# Patient Record
Sex: Male | Born: 1970 | Race: Black or African American | Hispanic: No | Marital: Married | Smoking: Current every day smoker
Health system: Southern US, Community
[De-identification: ages and names within clinical notes are randomized; demographics above are authoritative.]

## PROBLEM LIST (undated history)

## (undated) DIAGNOSIS — W3400XA Accidental discharge from unspecified firearms or gun, initial encounter: Secondary | ICD-10-CM

## (undated) DIAGNOSIS — J189 Pneumonia, unspecified organism: Secondary | ICD-10-CM

## (undated) HISTORY — PX: EYE SURGERY: SHX253

---
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
## 1997-08-10 ENCOUNTER — Inpatient Hospital Stay (HOSPITAL_COMMUNITY): Admission: EM | Admit: 1997-08-10 | Discharge: 1997-08-11 | Payer: Self-pay | Admitting: Emergency Medicine

## 1997-08-20 ENCOUNTER — Encounter: Admission: RE | Admit: 1997-08-20 | Discharge: 1997-08-20 | Payer: Self-pay | Admitting: Internal Medicine

## 1997-09-11 ENCOUNTER — Encounter: Admission: RE | Admit: 1997-09-11 | Discharge: 1997-09-11 | Payer: Self-pay | Admitting: Hematology and Oncology

## 2011-04-14 DIAGNOSIS — J189 Pneumonia, unspecified organism: Secondary | ICD-10-CM

## 2011-04-14 HISTORY — DX: Pneumonia, unspecified organism: J18.9

## 2011-09-11 ENCOUNTER — Emergency Department (HOSPITAL_COMMUNITY)
Admission: EM | Admit: 2011-09-11 | Discharge: 2011-09-11 | Disposition: A | Discharge status: Home / Self Care | Payer: Self-pay | Attending: Emergency Medicine | Admitting: Emergency Medicine

## 2011-09-11 ENCOUNTER — Encounter (HOSPITAL_COMMUNITY): Payer: Self-pay | Admitting: Emergency Medicine

## 2011-09-11 DIAGNOSIS — G8929 Other chronic pain: Secondary | ICD-10-CM | POA: Insufficient documentation

## 2011-09-11 DIAGNOSIS — K029 Dental caries, unspecified: Secondary | ICD-10-CM | POA: Insufficient documentation

## 2011-09-11 DIAGNOSIS — K0889 Other specified disorders of teeth and supporting structures: Secondary | ICD-10-CM

## 2011-09-11 DIAGNOSIS — K1370 Unspecified lesions of oral mucosa: Secondary | ICD-10-CM | POA: Insufficient documentation

## 2011-09-11 DIAGNOSIS — F17200 Nicotine dependence, unspecified, uncomplicated: Secondary | ICD-10-CM | POA: Insufficient documentation

## 2011-09-11 MED ORDER — PENICILLIN V POTASSIUM 500 MG PO TABS: 500.0000 mg | ORAL_TABLET | Freq: Four times a day (QID) | ORAL | Status: DC

## 2011-09-11 MED ORDER — HYDROCODONE-ACETAMINOPHEN 5-325 MG PO TABS: 1.0000 | ORAL_TABLET | Freq: Four times a day (QID) | ORAL | Status: DC | PRN

## 2011-09-11 NOTE — ED Notes (Signed)
Pt denies any questions upon discharge. 

## 2011-09-11 NOTE — Discharge Instructions (Signed)
Read the information below.  Call the dentist listed above for a close follow up appointment.  You may also use the resources below for further dental care.  If you develop high fevers, swelling in your throat or difficulty swallowing or breathing, return to the ER immediately.  You may return to the ER at any time for worsening condition or any new symptoms that concern you.   Dental Pain Toothache is pain in or around a tooth. It may get worse with chewing or with cold or heat.  HOME CARE  Your dentist may use a numbing medicine during treatment. If so, you may need to avoid eating until the medicine wears off. Ask your dentist about this.   Only take medicine as told by your dentist or doctor.   Avoid chewing food near the painful tooth until after all treatment is done. Ask your dentist about this.  GET HELP RIGHT AWAY IF:   The problem gets worse or new problems appear.   You have a fever.   There is redness and puffiness (swelling) of the face, jaw, or neck.   You cannot open your mouth.   There is pain in the jaw.   There is very bad pain that is not helped by medicine.  MAKE SURE YOU:   Understand these instructions.   Will watch your condition.   Will get help right away if you are not doing well or get worse.  Document Released: 09/16/2007 Document Revised: 03/19/2011 Document Reviewed: 09/16/2007 [facility] Patient Information 2012 [place], [place].  Dental Assistance If the dentist on-call cannot see you, please use the resources below:   Patients with Medicaid: [facility] [phone] [address], [phone] [address] [phone]  If unable to pay, or uninsured, contact [facility] [phone]) or [facility] Department [phone] in [place], [phone] in [facility]) to become qualified for the adult dental clinic  Other Low-Cost Community Dental Services: [facility] [address] Natasha Bence [place], [place],  [address]    [phone], Ext. [phone]    2nd and 4th Thursday of the month at 6:30am    10 clients each day by appointment, can sometimes see walk-in     patients if someone does not show for an appointment [facility]- [address] Ether Griffins [place], [place], [address]    [phone] [facility] [address], [place], [place], [address]    [phone]  [facility] Health Department- [phone] [facility] Health Department- [phone] [facility] Department- [phone]

## 2011-09-11 NOTE — ED Provider Notes (Signed)
History     CSN: [id number]  Arrival date & time 09/11/11  2146   First MD Initiated Contact with Patient 09/11/11 2252      Chief Complaint  Patient presents with  . Dental Pain    (Consider location/radiation/quality/duration/timing/severity/associated sxs/prior treatment) HPI Comments: Patient reports he has had chronic dental pain for many years, but over the past few days it had gotten much worse.  Reports pain in his left lower molars, also noted a new lesion on the roof of his mouth that is painless.  Denies fevers, sore throat, difficulty swallowing or breathing.  Does not have a dentist for follow up.    Patient is a 41 y.o. male presenting with tooth pain. The history is provided by the patient.  Dental PainPrimary symptoms do not include fever, shortness of breath or sore throat.  Additional symptoms do not include: trouble swallowing.    History reviewed. No pertinent past medical history.  History reviewed. No pertinent past surgical history.  No family history on file.  History  Substance Use Topics  . Smoking status: Current Everyday Smoker  . Smokeless tobacco: Not on file  . Alcohol Use: No      Review of Systems  Constitutional: Negative for fever and chills.  HENT: Positive for dental problem. Negative for sore throat and trouble swallowing.   Respiratory: Negative for shortness of breath.     Allergies  Review of patient's allergies indicates no known allergies.  Home Medications   Current Outpatient Rx  Name Route Sig Dispense Refill  . AMOXICILLIN 500 MG PO CAPS Oral Take 500 mg by mouth 4 (four) times daily.    . IBUPROFEN 800 MG PO TABS Oral Take 800 mg by mouth every 6 (six) hours as needed. For pain.      BP 133/82  Pulse 81  Temp(Src) 98 F (36.7 C) (Oral)  Resp 16  SpO2 96%  Physical Exam  Nursing note and vitals reviewed. Constitutional: He appears well-developed and well-nourished.  HENT:  Head: Normocephalic and  atraumatic.  Mouth/Throat: Uvula is midline. Oral lesions present. Dental caries present. No uvula swelling. Tonsillar abscesses present. No oropharyngeal exudate, posterior oropharyngeal edema or posterior oropharyngeal erythema.         Left lower molars with diffuse tenderness to percussion.    Neck: Neck supple.  Pulmonary/Chest: Effort normal.  Neurological: He is alert.    ED Course  Procedures (including critical care time)  Labs Reviewed - No data to display No results found.   1. Pain, dental   2. Mouth lesion       MDM  Afebrile patient with chronic dental pain and severe dental decay with increased pain over several days.  Pt also with painless papule over roof of mouth - does not appear to be related.  Pt d/c home with pain medication, antibiotic, dental resources.  Pt informed that he must call on call dentist within 48 hrs. Return precautions given.  Patient verbalizes understanding and agrees with plan.          Dillard Cannon [place], [place] 09/11/11 2318

## 2011-09-11 NOTE — ED Notes (Signed)
PT. REPORTS RIGHT UPPER MOLAR PAIN FOR 2 DAYS .  

## 2011-09-12 ENCOUNTER — Emergency Department (HOSPITAL_COMMUNITY)
Admission: EM | Admit: 2011-09-12 | Discharge: 2011-09-12 | Disposition: A | Discharge status: Home / Self Care | Payer: Self-pay | Attending: Emergency Medicine | Admitting: Emergency Medicine

## 2011-09-12 ENCOUNTER — Encounter (HOSPITAL_COMMUNITY): Payer: Self-pay | Admitting: *Deleted

## 2011-09-12 DIAGNOSIS — K029 Dental caries, unspecified: Secondary | ICD-10-CM | POA: Insufficient documentation

## 2011-09-12 DIAGNOSIS — K0889 Other specified disorders of teeth and supporting structures: Secondary | ICD-10-CM

## 2011-09-12 DIAGNOSIS — F17200 Nicotine dependence, unspecified, uncomplicated: Secondary | ICD-10-CM | POA: Insufficient documentation

## 2011-09-12 MED ORDER — OXYCODONE-ACETAMINOPHEN 5-325 MG PO TABS: 2.0000 | ORAL_TABLET | ORAL | Status: DC | PRN

## 2011-09-12 MED ORDER — OXYCODONE-ACETAMINOPHEN 5-325 MG PO TABS
2.0000 | ORAL_TABLET | Freq: Once | ORAL | Status: AC
  Administered 2011-09-12: 2 via ORAL
  Filled 2011-09-12: qty 2

## 2011-09-12 NOTE — ED Provider Notes (Signed)
Medical screening examination/treatment/procedure(s) were performed by non-physician practitioner and as supervising physician I was immediately available for consultation/collaboration.   Loren Racer, MD 09/12/11 0010

## 2011-09-12 NOTE — ED Notes (Signed)
Pa  at bedside. 

## 2011-09-12 NOTE — ED Notes (Signed)
Toothache for 3 days,  He wa seen here last pm and the pain ed is not strong enough

## 2011-09-12 NOTE — ED Notes (Signed)
On assessment, pt reports taking one of his own pain meds (vicodin, 1 tab) about 10 minutes ago. Requested pt that he not take any more of his own meds at this time

## 2011-09-12 NOTE — Discharge Instructions (Signed)
Apply warm compresses to jaw throughout the day. Take antibiotic and completion. Take hydrocodone-acetaminophen as directed, as needed for pain but do not drive or operate machinery with pain medication use. Alternate with ibuprofen use. Followup with a dentist is very important for ongoing evaluation and management of recurrent dental pain. However return to emergency department for emergent changing or worsening symptoms.   Dental Caries  Tooth decay (dental caries, cavities) is the most common of all oral diseases. It occurs in all ages but is more common in children and young adults.  CAUSES  Bacteria in your mouth combine with foods (particularly sugars and starches) to produce plaque. Plaque is a substance that sticks to the hard surfaces of teeth. The bacteria in the plaque produce acids that attack the enamel of teeth. Repeated acid attacks dissolve the enamel and create holes in the teeth. Root surfaces of teeth may also get these holes.  Other contributing factors include:   Frequent snacking and drinking of cavity-producing foods and liquids.   Poor oral hygiene.   Dry mouth.   Substance abuse such as methamphetamine.   Broken or poor fitting dental restorations.   Eating disorders.   Gastroesophageal reflux disease (GERD).   Certain radiation treatments to the head and neck.  SYMPTOMS  At first, dental decay appears as white, chalky areas on the enamel. In this early stage, symptoms are seldom present. As the decay progresses, pits and holes may appear on the enamel surfaces. Progression of the decay will lead to softening of the hard layers of the tooth. At this point you may experience some pain or achy feeling after sweet, hot, or cold foods or drinks are consumed. If left untreated, the decay will reach the internal structures of the tooth and produce severe pain. Extensive dental treatment, such as root canal therapy, may be needed to save the tooth at this late stage of  decay development.  DIAGNOSIS  Most cavities will be detected during regular check-ups. A thorough medical and dental history will be taken by the dentist. The dentist will use instruments to check the surfaces of your teeth for any breakdown or discoloration. Some dentists have special instruments, such as lasers, that detect tooth decay. Dental X-rays may also show some cavities that are not visible to the eye (such as between the contact areas of the teeth). TREATMENT  Treatment involves removal of the tooth decay and replacement with a restorative material such as silver, gold, or composite (white) material. However, if the decay involves a large area of the tooth and there is little remaining healthy tooth structure, a cap (crown) will be fitted over the remaining structure. If the decay involves the center part of the tooth (pulp), root canal treatment will be needed before any type of dental restoration is placed. If the tooth is severely destroyed by the decay process, leaving the remaining tooth structures unrestorable, the tooth will need to be pulled (extracted). Some early tooth decay may be reversed by fluoride treatments and thorough brushing and flossing at home. PREVENTION   Eat healthy foods. Restrict the amount of sugary, starchy foods and liquids you consume. Avoid frequent snacking and drinking of unhealthy foods and liquids.   Sealants can help with prevention of cavities. Sealants are composite resins applied onto the biting surfaces of teeth at risk for decay. They smooth out the pits and grooves and prevent food from being trapped in them. This is done in early childhood before tooth decay has started.  Fluoride tablets may also be prescribed to children between 6 months and 36 years of age if your drinking water is not fluoridated. The fluoride absorbed by the tooth enamel makes teeth less susceptible to decay. Thorough daily cleaning with a toothbrush and dental floss is the  best way to prevent cavities. Use of a fluoride toothpaste is highly recommended. Fluoride mouth rinses may be used in specific cases.   Topical application of fluoride by your dentist is important in children.   Regular visits with a dentist for checkups and cleanings are also important.  SEEK IMMEDIATE DENTAL CARE IF:  You have a fever.   You develop redness and swelling of your face, jaw, or neck.   You develop swelling around a tooth.   You are unable to open your mouth or cannot swallow.   You have severe pain uncontrolled by pain medicine.  Document Released: 12/20/2001 Document Revised: 03/19/2011 Document Reviewed: 09/04/2010 [facility] Patient Information 2012 [place], [place].

## 2011-09-12 NOTE — ED Provider Notes (Signed)
History     CSN: [id number]  Arrival date & time 09/12/11  2024   First MD Initiated Contact with Patient 09/12/11 2242      Chief Complaint  Patient presents with  . Dental Pain    (Consider location/radiation/quality/duration/timing/severity/associated sxs/prior treatment) Patient is a 41 y.o. male presenting with tooth pain. The history is provided by the patient and medical records.  Dental Pain  Patient was seen in emergency department yesterday for a three-day history of toothache associated with massive dental decay of upper and lower premolars and molar presents to emergency department stating that despite taking his penicillin in his pain medication he has ongoing pain. Patient states pain medication will give brief temporary improvement in pain and then the pain returns. He denies fevers, chills, headache, dizziness, nausea, vomiting, difficulty breathing or swallowing. He denies any facial swelling. Patient has no known medical problems and takes no medicines on regular basis. Patient states he did not call the on-call dentist because it was the weekend and he states "I was afraid I cannot pay the dentist anyway." Patient states pain is aggravated by cold air and by chewing.  History reviewed. No pertinent past medical history.  History reviewed. No pertinent past surgical history.  No family history on file.  History  Substance Use Topics  . Smoking status: Current Everyday Smoker  . Smokeless tobacco: Not on file  . Alcohol Use: No      Review of Systems  All other systems reviewed and are negative.    Allergies  Review of patient's allergies indicates no known allergies.  Home Medications   Current Outpatient Rx  Name Route Sig Dispense Refill  . HYDROCODONE-ACETAMINOPHEN 5-325 MG PO TABS Oral Take 1 tablet by mouth every 6 (six) hours as needed. For pain    . IBUPROFEN 800 MG PO TABS Oral Take 800 mg by mouth every 6 (six) hours as needed. For pain.    Marland Kitchen  PENICILLIN V POTASSIUM 500 MG PO TABS Oral Take 500 mg by mouth 4 (four) times daily.      BP 142/94  Pulse 64  Temp(Src) 99.2 F (37.3 C) (Oral)  Resp 16  SpO2 97%  Physical Exam  Nursing note and vitals reviewed. Constitutional: He is oriented to person, place, and time. He appears well-developed and well-nourished. No distress.  HENT:  Head: Normocephalic and atraumatic.  Right Ear: External ear normal.  Left Ear: External ear normal.  Mouth/Throat: Oropharynx is clear and moist.       Patent airway without uvula, lingual or sublingual edema.   Massive dental decay of bilateral upper and lower premolars and molars with TTP and gingivitis but no gingival mass or fluctuance.   Eyes: Conjunctivae are normal.  Neck: Normal range of motion. Neck supple.  Cardiovascular: Normal rate.   Pulmonary/Chest: Effort normal.  Musculoskeletal: Normal range of motion.  Lymphadenopathy:    He has no cervical adenopathy.  Neurological: He is alert and oriented to person, place, and time.  Skin: Skin is warm and dry. No rash noted. He is not diaphoretic. No erythema.  Psychiatric: He has a normal mood and affect.    ED Course  Procedures (including critical care time)  PO percocet  Labs Reviewed - No data to display No results found.   1. Pain, dental   2. Dental decay       MDM  Dental pain associated with dental decay but afebrile, non toxic appearing and NAD. No signs or symptoms  of dental abscess. I spoke at length with patient about dental pain associated with dental decay and that additional pain would be recurrent and ongoing until the dental decay was seen, evaluated, and fixed by dentist. He voices his understanding. I spoke at length with patient about the importance of calling the on-call dentist to be seen. Patient voices his understanding.        [place], [place] 09/13/11 0000

## 2011-09-13 ENCOUNTER — Encounter (HOSPITAL_COMMUNITY): Payer: Self-pay | Admitting: *Deleted

## 2011-09-13 ENCOUNTER — Emergency Department (HOSPITAL_COMMUNITY)
Admission: EM | Admit: 2011-09-13 | Discharge: 2011-09-13 | Disposition: A | Discharge status: Home / Self Care | Payer: Self-pay | Attending: Emergency Medicine | Admitting: Emergency Medicine

## 2011-09-13 DIAGNOSIS — K047 Periapical abscess without sinus: Secondary | ICD-10-CM | POA: Insufficient documentation

## 2011-09-13 DIAGNOSIS — F17200 Nicotine dependence, unspecified, uncomplicated: Secondary | ICD-10-CM | POA: Insufficient documentation

## 2011-09-13 MED ORDER — OXYCODONE-ACETAMINOPHEN 5-325 MG PO TABS: 1.0000 | ORAL_TABLET | Freq: Four times a day (QID) | ORAL | Status: AC | PRN

## 2011-09-13 MED ORDER — OXYCODONE-ACETAMINOPHEN 5-325 MG PO TABS
1.0000 | ORAL_TABLET | Freq: Once | ORAL | Status: AC
  Administered 2011-09-13: 1 via ORAL
  Filled 2011-09-13: qty 1

## 2011-09-13 NOTE — ED Provider Notes (Signed)
History     CSN: [id number]  Arrival date & time 09/13/11  1334   First MD Initiated Contact with Patient 09/13/11 1353      Chief Complaint  Patient presents with  . Dental Pain  . Facial Swelling    (Consider location/radiation/quality/duration/timing/severity/associated sxs/prior treatment) HPI  Patient presents to the emergency department with a dental complaint. Symptoms began 1 week ago. The patient has tried to alleviate pain with hydrocodone.  Pain rated at a 10/10, characterized as throbbing in nature and located left lower jaw. Patient denies fever, night sweats, chills, difficulty swallowing or opening mouth, SOB, nuchal rigidity or decreased ROM of neck.  Patient does not have a dentist and requests a resource guide at discharge.  Patient seen in ED 2 times prior in the past week for pain. Has been not taking more than prescribed hydrocodone and has been taking the appropriate amount of Penicillin. He presents with facial swelling today.    History reviewed. No pertinent past medical history.  History reviewed. No pertinent past surgical history.  No family history on file.  History  Substance Use Topics  . Smoking status: Current Everyday Smoker  . Smokeless tobacco: Not on file  . Alcohol Use: No      Review of Systems   HEENT: denies blurry vision or change in hearing PULMONARY: Denies difficulty breathing and SOB CARDIAC: denies chest pain or heart palpitations MUSCULOSKELETAL:  denies being unable to ambulate ABDOMEN AL: denies abdominal pain GU: denies loss of bowel or urinary control NEURO: denies numbness and tingling in extremities SKIN: no new rashes PSYCH: patient behavior is normal NECK: Not complaining of neck pain     Allergies  Review of patient's allergies indicates no known allergies.  Home Medications   Current Outpatient Rx  Name Route Sig Dispense Refill  . HYDROCODONE-ACETAMINOPHEN 5-325 MG PO TABS Oral Take 1 tablet by  mouth every 6 (six) hours as needed. For pain    . IBUPROFEN 800 MG PO TABS Oral Take 800 mg by mouth every 6 (six) hours as needed. For pain.    Marland Kitchen PENICILLIN V POTASSIUM 500 MG PO TABS Oral Take 500 mg by mouth 4 (four) times daily.    . OXYCODONE-ACETAMINOPHEN 5-325 MG PO TABS Oral Take 1 tablet by mouth every 6 (six) hours as needed for pain. 10 tablet 0    BP 128/90  Pulse 75  Temp(Src) 98.8 F (37.1 C) (Oral)  Resp 19  SpO2 97%  Physical Exam  Nursing note and vitals reviewed. Constitutional: He appears well-developed and well-nourished.  HENT:  Head: Normocephalic and atraumatic. No trismus in the jaw.  Mouth/Throat: Oropharynx is clear and moist. Dental abscesses (1cm dental abscess to medial side of molar) and dental caries present.    Eyes: Conjunctivae and EOM are normal. Pupils are equal, round, and reactive to light.  Neck: Normal range of motion. Neck supple.  Cardiovascular: Normal rate and regular rhythm.   Pulmonary/Chest: Effort normal and breath sounds normal.    ED Course  Procedures (including critical care time)  Labs Reviewed - No data to display No results found.   1. Dental abscess       MDM   INCISION AND DRAINAGE Performed by: Dorthula Matas Consent: Verbal consent obtained. Risks and benefits: risks, benefits and alternatives were discussed Type: abscess  Body area: left lower dental abscess  Anesthesia: local infiltration  Local anesthetic: topical mucosal hurricane spray  Anesthetic total: 1 second of spray  Complexity:  complex Blunt dissection to break up loculations  Drainage: purulent  Drainage amount: large  Packing material: none  Patient tolerance: Patient tolerated the procedure well with no immediate complications.   Patient given Percocet in ED, he states it worked better. Abscess drained which should provider significant relief. Patient still needs to continue antibiotics, discont hydrocodone and follow-up  with Dentist.  Pt given Rx for Percocets 5-325 (10 tabs) . Patient informed that they need to find a dentist and have the tooth pulled or the symptoms may be reoccurring. A Resource guide has been given with dental providers. Patient has been given return to ED precautions.          Dorthula Matas, PA 09/13/11 1439

## 2011-09-13 NOTE — ED Provider Notes (Signed)
Medical screening examination/treatment/procedure(s) were performed by non-physician practitioner and as supervising physician I was immediately available for consultation/collaboration.  Geoffery Lyons, MD 09/13/11 1540

## 2011-09-13 NOTE — Discharge Instructions (Signed)
Dental Abscess A dental abscess usually starts from an infected tooth. Antibiotic medicine and pain pills can be helpful, but dental infections require the attention of a dentist. Rinse around the infected area often with salt water (a pinch of salt in 8 oz of warm water). Do not apply heat to the outside of your face. See your dentist or oral surgeon as soon as possible.  SEEK IMMEDIATE MEDICAL CARE IF:  You have increasing, severe pain that is not relieved by medicine.   You or your child has an oral temperature above 102 F (38.9 C), not controlled by medicine.   Your baby is older than 3 months with a rectal temperature of 102 F (38.9 C) or higher.   Your baby is 3 months old or younger with a rectal temperature of 100.4 F (38 C) or higher.   You develop chills, severe headache, difficulty breathing, or trouble swallowing.   You have swelling in the neck or around the eye.  Document Released: 03/30/2005 Document Revised: 03/19/2011 Document Reviewed: 09/08/2006 [employer] Patient Information 2012 [employer]  RESOURCE GUIDE  Dental Problems  Patients with Medicaid: [facility]                     [facility] [address]                                           [address] Phone:  [phone]                                                  Phone:  [phone]  If unable to pay or uninsured, contact:  [facility] or [facility] to become qualified for the adult dental clinic.  Chronic Pain Problems Contact [facility]  [phone] Patients need to be referred by their primary care doctor.  Insufficient Money for Medicine Contact [employer]:  call "211" or [facility] 271-5999.  No Primary Care Doctor Call [facility]  832-8000 Other agencies that provide inexpensive medical care    [facility]  832-8035    [facility]  [phone]    [facility]   [phone]    Women's Clinic  832-4777    [facility]  [phone]    [facility]  [phone]  Psychological Services [facility]  [phone] [facility]  [phone] [facility]   800 853-5163 (emergency services 641-4993)  Substance Abuse Resources [facility]  336-882-2125 [facility] 336-784-9470 [facility] 336-285-9073 [facility] 336-845-3988 Residential & Outpatient Substance Abuse Program  800-659-3381  Abuse/Neglect [place] Child Abuse Hotline (336) 641-3795 [place] Child Abuse Hotline 800-378-5315 (After Hours)  Emergency Shelter [facility] (336) 271-5985  Maternity Homes [facility] (336) 275-9566 [facility] (704) 372-4663  MRSA Hotline #:   832-7006    [place] Resources  [facility] [place]     [employer]                          [facility]   Dept. [address] [place]                       [address]      [address]  [place]                                                [place]                            [place] Phone:  [phone]                                   Phone:  [phone]                 Phone:  [phone]  [facility] Phone:  [phone]  [place] Child Abuse Hotline (336) 342-1394 (336) 342-3537 (After Hours)   

## 2011-09-13 NOTE — ED Notes (Signed)
Pt reports jaw swelling and noted blood when he spit- pt believes abscess has burst.  Pt reports he was seen for tooth abscess to left lower tooth. Pt was seen on Friday for the same and was given antibiotics and pain pills. Pt reports pain pills are not helping. Pt feels pain and swelling are worse today.  Pt has not made an appointment with dentist at this time. Pt reports last had Norco at 9AM.  Pt was seen last night for the same and given a percocet that improved his pain, but was not given a prescription.

## 2011-09-17 NOTE — ED Provider Notes (Signed)
Medical screening examination/treatment/procedure(s) were performed by non-physician practitioner and as supervising physician I was immediately available for consultation/collaboration.  Raeford Razor, MD 09/17/11 [id number]

## 2013-05-30 ENCOUNTER — Encounter (HOSPITAL_COMMUNITY): Payer: Self-pay | Admitting: Emergency Medicine

## 2013-05-30 ENCOUNTER — Emergency Department (HOSPITAL_COMMUNITY)
Admission: EM | Admit: 2013-05-30 | Discharge: 2013-05-30 | Disposition: A | Discharge status: Home / Self Care | Payer: Self-pay | Attending: Emergency Medicine | Admitting: Emergency Medicine

## 2013-05-30 DIAGNOSIS — K0889 Other specified disorders of teeth and supporting structures: Secondary | ICD-10-CM

## 2013-05-30 DIAGNOSIS — Z7982 Long term (current) use of aspirin: Secondary | ICD-10-CM | POA: Insufficient documentation

## 2013-05-30 DIAGNOSIS — K089 Disorder of teeth and supporting structures, unspecified: Secondary | ICD-10-CM | POA: Insufficient documentation

## 2013-05-30 DIAGNOSIS — F17200 Nicotine dependence, unspecified, uncomplicated: Secondary | ICD-10-CM | POA: Insufficient documentation

## 2013-05-30 DIAGNOSIS — K029 Dental caries, unspecified: Secondary | ICD-10-CM | POA: Insufficient documentation

## 2013-05-30 MED ORDER — PENICILLIN V POTASSIUM 500 MG PO TABS: 500.0000 mg | ORAL_TABLET | Freq: Four times a day (QID) | ORAL | Status: DC

## 2013-05-30 MED ORDER — OXYCODONE-ACETAMINOPHEN 5-325 MG PO TABS
1.0000 | ORAL_TABLET | Freq: Once | ORAL | Status: AC
  Administered 2013-05-30: 1 via ORAL
  Filled 2013-05-30: qty 1

## 2013-05-30 MED ORDER — HYDROCODONE-ACETAMINOPHEN 5-325 MG PO TABS: 1.0000 | ORAL_TABLET | Freq: Four times a day (QID) | ORAL | Status: DC | PRN

## 2013-05-30 MED ORDER — IBUPROFEN 200 MG PO TABS
600.0000 mg | ORAL_TABLET | Freq: Once | ORAL | Status: AC
  Administered 2013-05-30: 600 mg via ORAL
  Filled 2013-05-30: qty 3

## 2013-05-30 MED ORDER — PENICILLIN V POTASSIUM 500 MG PO TABS
500.0000 mg | ORAL_TABLET | Freq: Once | ORAL | Status: AC
  Administered 2013-05-30: 500 mg via ORAL
  Filled 2013-05-30: qty 1

## 2013-05-30 NOTE — ED Notes (Signed)
Bed: [id number] Expected date:  Expected time:  Means of arrival:  Comments: toothache

## 2013-05-30 NOTE — Discharge Instructions (Signed)

## 2013-05-30 NOTE — ED Provider Notes (Signed)
CSN: [id number]     Arrival date & time 05/30/13  [id number] History   First MD Initiated Contact with Patient 05/30/13 [phone]     Chief Complaint  Patient presents with  . Dental Pain     (Consider location/radiation/quality/duration/timing/severity/associated sxs/prior Treatment) HPI Dental pain x 1 week. No fevers or chills. Moderate pain. No facial swelling. No other complaints. Tooth #27  History reviewed. No pertinent past medical history. History reviewed. No pertinent past surgical history. History reviewed. No pertinent family history. History  Substance Use Topics  . Smoking status: Current Every Day Smoker    Types: Cigarettes  . Smokeless tobacco: Not on file  . Alcohol Use: No    Review of Systems  Constitutional: Negative for fever and chills.  HENT: Positive for dental problem. Negative for facial swelling.       Allergies  Review of patient's allergies indicates no known allergies.  Home Medications   Current Outpatient Rx  Name  Route  Sig  Dispense  Refill  . aspirin 325 MG EC tablet   Oral   Take 325 mg by mouth. Every 2 hours as needed for pain (places at the gum line to dissolve every time he takes an ibuprofen)         . Aspirin-Salicylamide-Caffeine (BC HEADACHE POWDER PO)   Oral   Take 1 Package by mouth every 4 (four) hours as needed (pain).         Marland Kitchen. ibuprofen (ADVIL,MOTRIN) 200 MG tablet   Oral   Take 400 mg by mouth. Every 2 hours as needed for pain         . OVER THE COUNTER MEDICATION   Oral   Take 500 mg by mouth 2 (two) times daily as needed (pain).         Marland Kitchen. HYDROcodone-acetaminophen (NORCO/VICODIN) 5-325 MG per tablet   Oral   Take 1 tablet by mouth every 6 (six) hours as needed for moderate pain.   10 tablet   0   . penicillin v potassium (VEETID) 500 MG tablet   Oral   Take 1 tablet (500 mg total) by mouth 4 (four) times daily.   28 tablet   0    BP 138/93  Pulse 98  Temp(Src) 98.9 F (37.2 C) (Oral)  Resp 18   SpO2 96% Physical Exam  Nursing note and vitals reviewed. Constitutional: He is oriented to person, place, and time. He appears well-developed and well-nourished.  HENT:  Head: Normocephalic.  Dental decay tooth #27 with tenderness. No gingival swelling or fluctuance. No facial swelling  Eyes: EOM are normal.  Neck: Normal range of motion.  Pulmonary/Chest: Effort normal.  Abdominal: He exhibits no distension.  Musculoskeletal: Normal range of motion.  Neurological: He is alert and oriented to person, place, and time.  Psychiatric: He has a normal mood and affect.    ED Course  Procedures (including critical care time) Labs Review Labs Reviewed - No data to display Imaging Review No results found.  EKG Interpretation   None       MDM   Final diagnoses:  Pain, dental    Dental Pain. Home with antibiotics and pain medicine. Recommend dental follow up. No signs of gingival abscess. Tolerating secretions. Airway patent. No sub lingular swelling     Lyanne CoKevin M Binyamin Nelis, MD 05/30/13 [phone]

## 2013-05-30 NOTE — ED Notes (Addendum)
Pt escorted to discharge window. Verbalized understanding discharge instructions. In no acute distress.  Pt directed not to drive or operate heavy machinery while on medication.

## 2013-05-30 NOTE — ED Notes (Signed)
Per EMS, Pt, from home, c/o lower R side dental pain x 1 week.

## 2014-02-28 ENCOUNTER — Encounter (HOSPITAL_COMMUNITY): Payer: Self-pay | Admitting: Emergency Medicine

## 2014-02-28 ENCOUNTER — Emergency Department (HOSPITAL_COMMUNITY)
Admission: EM | Admit: 2014-02-28 | Discharge: 2014-02-28 | Discharge status: Against Medical Advice | Payer: Self-pay | Attending: Emergency Medicine | Admitting: Emergency Medicine

## 2014-02-28 DIAGNOSIS — K648 Other hemorrhoids: Secondary | ICD-10-CM | POA: Insufficient documentation

## 2014-02-28 DIAGNOSIS — Z720 Tobacco use: Secondary | ICD-10-CM | POA: Insufficient documentation

## 2014-02-28 DIAGNOSIS — K645 Perianal venous thrombosis: Secondary | ICD-10-CM

## 2014-02-28 DIAGNOSIS — Z792 Long term (current) use of antibiotics: Secondary | ICD-10-CM | POA: Insufficient documentation

## 2014-02-28 MED ORDER — HYDROMORPHONE HCL 1 MG/ML IJ SOLN: 1.0000 mg | Freq: Once | INTRAMUSCULAR | Status: DC

## 2014-02-28 MED ORDER — HYDROCODONE-ACETAMINOPHEN 5-325 MG PO TABS
1.0000 | ORAL_TABLET | Freq: Once | ORAL | Status: AC
  Administered 2014-02-28: 1 via ORAL
  Filled 2014-02-28: qty 1

## 2014-02-28 MED ORDER — HYDROMORPHONE HCL 1 MG/ML IJ SOLN
1.0000 mg | Freq: Once | INTRAMUSCULAR | Status: DC
Start: 2014-02-28 — End: 2014-03-01

## 2014-02-28 NOTE — ED Notes (Signed)
Pt states has been moving furniture x 1 week and  Anal area started to swell yesterday , denies any hard bowel movements , or FB  Or diarrhea ,  Has not tried any of the OTC meds

## 2014-02-28 NOTE — ED Notes (Signed)
Pt requesting a prostate exam PA aware up to void

## 2014-02-28 NOTE — ED Notes (Addendum)
Told pt he was going to have to have ride home before I could give him more pain med. Dr Otho [facility]etrancour emily [facility].  Tech noticed that pts gown was on bed and cleaned room > i called for pt out in lobby pt cannot be found dr notified

## 2014-02-28 NOTE — ED Provider Notes (Signed)
CSN: [id number]     Arrival date & time 02/28/14  [id number] History   First MD Initiated Contact with Patient 02/28/14 0845     Chief Complaint  Patient presents with  . Rectal Pain   Francisco Rose is a 43 y.o. male presents the ED complaining of rectal swelling and pain since yesterday. Patient plans of 8 out of 10 pain in his rectum that is worse with bowel movements and sitting. Reports had a bowel movement this morning with some bright red blood in his toilet. He is not attempted any treatments. He denies history of hemorrhoids for abscesses. He denies pus or discharge from his rectum. He denies hard stools.  He denies fevers, chills, abdominal pain,  dysuria, hematuria, difficulty urinating, melena, constipation or diarrhea.   (Consider location/radiation/quality/duration/timing/severity/associated sxs/prior Treatment) HPI  History reviewed. No pertinent past medical history. History reviewed. No pertinent past surgical history. No family history on file. History  Substance Use Topics  . Smoking status: Current Every Day Smoker    Types: Cigarettes  . Smokeless tobacco: Not on file  . Alcohol Use: No    Review of Systems  Constitutional: Negative for fever, chills and fatigue.  HENT: Negative for congestion, ear pain, sore throat and trouble swallowing.   Eyes: Negative for pain and visual disturbance.  Respiratory: Negative for cough, shortness of breath and wheezing.   Cardiovascular: Negative for chest pain and palpitations.  Gastrointestinal: Positive for rectal pain. Negative for nausea, vomiting, abdominal pain and diarrhea.  Genitourinary: Negative for dysuria, urgency, frequency, hematuria, flank pain, decreased urine volume, difficulty urinating, genital sores, penile pain and testicular pain.  Musculoskeletal: Negative for myalgias, back pain and neck pain.  Skin: Negative for pallor and wound.  Neurological: Negative for weakness, light-headedness and headaches.  All  other systems reviewed and are negative.     Allergies  Review of patient's allergies indicates no known allergies.  Home Medications   Prior to Admission medications   Medication Sig Start Date End Date Taking? Authorizing Provider  aspirin 325 MG EC tablet Take 325 mg by mouth. Every 2 hours as needed for pain (places at the gum line to dissolve every time he takes an ibuprofen)    Historical Provider, MD  Aspirin-Salicylamide-Caffeine (BC HEADACHE POWDER PO) Take 1 Package by mouth every 4 (four) hours as needed (pain).    Historical Provider, MD  HYDROcodone-acetaminophen (NORCO/VICODIN) 5-325 MG per tablet Take 1 tablet by mouth every 6 (six) hours as needed for moderate pain. 05/30/13   Lyanne CoKevin M Campos, MD  ibuprofen (ADVIL,MOTRIN) 200 MG tablet Take 400 mg by mouth. Every 2 hours as needed for pain    Historical Provider, MD  OVER THE COUNTER MEDICATION Take 500 mg by mouth 2 (two) times daily as needed (pain).    Historical Provider, MD  penicillin v potassium (VEETID) 500 MG tablet Take 1 tablet (500 mg total) by mouth 4 (four) times daily. 05/30/13   Lyanne CoKevin M Campos, MD   BP 137/91 mmHg  Pulse 82  Temp(Src) 98.2 F (36.8 C) (Oral)  Resp 18  SpO2 98% Physical Exam  Constitutional: He appears well-developed and well-nourished. No distress.  HENT:  Head: Normocephalic and atraumatic.  Mouth/Throat: Oropharynx is clear and moist.  Eyes: Conjunctivae are normal. Pupils are equal, round, and reactive to light. Right eye exhibits no discharge. Left eye exhibits no discharge.  Neck: Neck supple.  Cardiovascular: Normal rate, regular rhythm, normal heart sounds and intact distal pulses.  Exam reveals  no gallop and no friction rub.   No murmur heard. Pulmonary/Chest: Effort normal and breath sounds normal. No respiratory distress. He has no wheezes. He has no rales.  Abdominal: Soft. Bowel sounds are normal. He exhibits no distension and no mass. There is no tenderness. There is no  rebound and no guarding.  Genitourinary:  Rectal exam performed with male chaperone. Firm and tender internal hemorrhoid on the left aspect of his rectum that extends exteriorly which is concerning for a thrombosed internal hemorrhoid.   Musculoskeletal: He exhibits no edema.  Lymphadenopathy:    He has no cervical adenopathy.  Neurological: He is alert. Coordination normal.  Skin: Skin is warm and dry. No rash noted. He is not diaphoretic. No erythema. No pallor.  Psychiatric: He has a normal mood and affect. His behavior is normal.  Nursing note and vitals reviewed.   ED Course  Procedures (including critical care time) Labs Review Labs Reviewed - No data to display  Imaging Review No results found.   EKG Interpretation None      Filed Vitals:   02/28/14 0845  BP: 137/91  Pulse: 82  Temp: 98.2 F (36.8 C)  TempSrc: Oral  Resp: 18  SpO2: 98%     MDM   Final diagnoses:  None   10:05 AM patient is evaluated by Dr. Manus Gunningancour who is concerned that this might be a internal thrombosed hemorrhoid. Will consult surgery at this time.  10:19 AM nursing staff reports that the patient did not have a ride home, and thus could not get Dilaudid at this time. The patient told nursing staff that he wanted to go get a ride and come back to the ED. They informed him that he should not leave and he might need surgery. The patient told nursing staff he was going to urinate, but he apparently changed out of his gown and left AMA. He was not able to be found by nursing staff and I was unable to talk to him before he left AMA.   Patient was discussed with and evaluated by Dr. Manus Gunningancour, but patient left AMA before surgery consult was completed.   Lawana ChambersWilliam Duncan Mio Schellinger, PA-C 02/28/14 1046  Glynn OctaveStephen Rancour, MD 02/28/14 [phone]

## 2014-12-06 ENCOUNTER — Emergency Department (HOSPITAL_COMMUNITY)
Admission: EM | Admit: 2014-12-06 | Discharge: 2014-12-06 | Disposition: A | Discharge status: Home / Self Care | Payer: Self-pay | Attending: Emergency Medicine | Admitting: Emergency Medicine

## 2014-12-06 ENCOUNTER — Encounter (HOSPITAL_COMMUNITY): Payer: Self-pay | Admitting: Neurology

## 2014-12-06 DIAGNOSIS — F1910 Other psychoactive substance abuse, uncomplicated: Secondary | ICD-10-CM

## 2014-12-06 DIAGNOSIS — F1410 Cocaine abuse, uncomplicated: Secondary | ICD-10-CM | POA: Insufficient documentation

## 2014-12-06 DIAGNOSIS — Z720 Tobacco use: Secondary | ICD-10-CM | POA: Insufficient documentation

## 2014-12-06 LAB — CBC
HEMATOCRIT: 46.7 % (ref 39.0–52.0)
HEMOGLOBIN: 15.7 g/dL (ref 13.0–17.0)
MCH: 30.4 pg (ref 26.0–34.0)
MCHC: 33.6 g/dL (ref 30.0–36.0)
MCV: 90.5 fL (ref 78.0–100.0)
Platelets: 293 10*3/uL (ref 150–400)
RBC: 5.16 MIL/uL (ref 4.22–5.81)
RDW: 14 % (ref 11.5–15.5)
WBC: 7.9 10*3/uL (ref 4.0–10.5)

## 2014-12-06 LAB — COMPREHENSIVE METABOLIC PANEL
ALBUMIN: 3.5 g/dL (ref 3.5–5.0)
ALK PHOS: 60 U/L (ref 38–126)
ALT: 20 U/L (ref 17–63)
AST: 23 U/L (ref 15–41)
Anion gap: 10 (ref 5–15)
BILIRUBIN TOTAL: 0.8 mg/dL (ref 0.3–1.2)
BUN: 8 mg/dL (ref 6–20)
CALCIUM: 8.6 mg/dL — AB (ref 8.9–10.3)
CO2: 25 mmol/L (ref 22–32)
CREATININE: 1.27 mg/dL — AB (ref 0.61–1.24)
Chloride: 105 mmol/L (ref 101–111)
GFR calc Af Amer: >60 mL/min (ref >60)
GFR calc non Af Amer: >60 mL/min (ref >60)
GLUCOSE: 96 mg/dL (ref 65–99)
Potassium: 3.8 mmol/L (ref 3.5–5.1)
SODIUM: 140 mmol/L (ref 135–145)
TOTAL PROTEIN: 5.8 g/dL — AB (ref 6.5–8.1)

## 2014-12-06 LAB — RAPID URINE DRUG SCREEN, HOSP PERFORMED
Amphetamines: NOT DETECTED
BARBITURATES: NOT DETECTED
BENZODIAZEPINES: NOT DETECTED
COCAINE: POSITIVE — AB
Opiates: NOT DETECTED
TETRAHYDROCANNABINOL: NOT DETECTED

## 2014-12-06 LAB — ACETAMINOPHEN LEVEL: Acetaminophen (Tylenol), Serum: <10 ug/mL — ABNORMAL LOW (ref 10–30)

## 2014-12-06 LAB — SALICYLATE LEVEL: Salicylate Lvl: <4 mg/dL (ref 2.8–30.0)

## 2014-12-06 LAB — ETHANOL: Alcohol, Ethyl (B): <5 mg/dL (ref <5)

## 2014-12-06 NOTE — ED Notes (Addendum)
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 Last drank 40 oz beer this morning, last used crack cocaine at 0900 today. Denies SI or HI. Pt is a x 4. In NAD, is calm.

## 2014-12-06 NOTE — ED Notes (Signed)
Resource guide given to pt for outpatient treatment and explained. Pt verbalizes understanding.

## 2014-12-06 NOTE — Discharge Instructions (Signed)
It was our pleasure to provide your ER care today - we hope that you feel better.  Follow up with AA, and use the resource guide provided below for additional community substance abuse resources.  Return to ER if worse, new symptoms, medical emergency, other concern.      Alcohol Use Disorder Alcohol use disorder is a mental disorder. It is not a one-time incident of heavy drinking. Alcohol use disorder is the excessive and uncontrollable use of alcohol over time that leads to problems with functioning in one or more areas of daily living. People with this disorder risk harming themselves and others when they drink to excess. Alcohol use disorder also can cause other mental disorders, such as mood and anxiety disorders, and serious physical problems. People with alcohol use disorder often misuse other drugs.  Alcohol use disorder is common and widespread. Some people with this disorder drink alcohol to cope with or escape from negative life events. Others drink to relieve chronic pain or symptoms of mental illness. People with a family history of alcohol use disorder are at higher risk of losing control and using alcohol to excess.  SYMPTOMS  Signs and symptoms of alcohol use disorder may include the following:   Consumption ofalcohol inlarger amounts or over a longer period of time than intended.  Multiple unsuccessful attempts to cutdown or control alcohol use.   A great deal of time spent obtaining alcohol, using alcohol, or recovering from the effects of alcohol (hangover).  A strong desire or urge to use alcohol (cravings).   Continued use of alcohol despite problems at work, school, or home because of alcohol use.   Continued use of alcohol despite problems in relationships because of alcohol use.  Continued use of alcohol in situations when it is physically hazardous, such as driving a car.  Continued use of alcohol despite awareness of a physical or psychological problem  that is likely related to alcohol use. Physical problems related to alcohol use can involve the brain, heart, liver, stomach, and intestines. Psychological problems related to alcohol use include intoxication, depression, anxiety, psychosis, delirium, and dementia.   The need for increased amounts of alcohol to achieve the same desired effect, or a decreased effect from the consumption of the same amount of alcohol (tolerance).  Withdrawal symptoms upon reducing or stopping alcohol use, or alcohol use to reduce or avoid withdrawal symptoms. Withdrawal symptoms include:  Racing heart.  Hand tremor.  Difficulty sleeping.  Nausea.  Vomiting.  Hallucinations.  Restlessness.  Seizures. DIAGNOSIS Alcohol use disorder is diagnosed through an assessment by your health care provider. Your health care provider may start by asking three or four questions to screen for excessive or problematic alcohol use. To confirm a diagnosis of alcohol use disorder, at least two symptoms must be present within a 50-month period. The severity of alcohol use disorder depends on the number of symptoms:  Mild--two or three.  Moderate--four or five.  Severe--six or more. Your health care provider may perform a physical exam or use results from lab tests to see if you have physical problems resulting from alcohol use. Your health care provider may refer you to a mental health professional for evaluation. TREATMENT  Some people with alcohol use disorder are able to reduce their alcohol use to low-risk levels. Some people with alcohol use disorder need to quit drinking alcohol. When necessary, mental health professionals with specialized training in substance use treatment can help. Your health care provider can help you decide  how severe your alcohol use disorder is and what type of treatment you need. The following forms of treatment are available:   Detoxification. Detoxification involves the use of prescription  medicines to prevent alcohol withdrawal symptoms in the first week after quitting. This is important for people with a history of symptoms of withdrawal and for heavy drinkers who are likely to have withdrawal symptoms. Alcohol withdrawal can be dangerous and, in severe cases, cause death. Detoxification is usually provided in a hospital or in-patient substance use treatment facility.  Counseling or talk therapy. Talk therapy is provided by substance use treatment counselors. It addresses the reasons people use alcohol and ways to keep them from drinking again. The goals of talk therapy are to help people with alcohol use disorder find healthy activities and ways to cope with life stress, to identify and avoid triggers for alcohol use, and to handle cravings, which can cause relapse.  Medicines.Different medicines can help treat alcohol use disorder through the following actions:  Decrease alcohol cravings.  Decrease the positive reward response felt from alcohol use.  Produce an uncomfortable physical reaction when alcohol is used (aversion therapy).  Support groups. Support groups are run by people who have quit drinking. They provide emotional support, advice, and guidance. These forms of treatment are often combined. Some people with alcohol use disorder benefit from intensive combination treatment provided by specialized substance use treatment centers. Both inpatient and outpatient treatment programs are available. Document Released: 05/07/2004 Document Revised: 08/14/2013 Document Reviewed: 07/07/2012 [facility] Patient Information 2015 [place], [place]. This information is not intended to replace advice given to you by your health care provider. Make sure you discuss any questions you have with your health care provider.   Stimulant Use Disorder-Cocaine Cocaine is one of a group of powerful drugs called stimulants. Cocaine has medical uses for stopping nosebleeds and for pain control before  minor nose or dental surgery. However, cocaine is misused because of the effects that it produces. These effects include:   A feeling of extreme pleasure.  Alertness.  High energy. Common street names for cocaine include coke, crack, blow, snow, and nose candy. Cocaine is snorted, dissolved in water and injected, or smoked.  Stimulants are addictive because they activate regions of the brain that produce both the pleasurable sensation of "reward" and psychological dependence. Together, these actions account for loss of control and the rapid development of drug dependence. This means you become ill without the drug (withdrawal) and need to keep using it to function.  Stimulant use disorder is use of stimulants that disrupts your daily life. It disrupts relationships with family and friends and how you do your job. Cocaine increases your blood pressure and heart rate. It can cause a heart attack or stroke. Cocaine can also cause death from irregular heart rate or seizures. SYMPTOMS Symptoms of stimulant use disorder with cocaine include:  Use of cocaine in larger amounts or over a longer period of time than intended.  Unsuccessful attempts to cut down or control cocaine use.  A lot of time spent obtaining, using, or recovering from the effects of cocaine.  A strong desire or urge to use cocaine (craving).  Continued use of cocaine in spite of major problems at work, school, or home because of use.  Continued use of cocaine in spite of relationship problems because of use.  Giving up or cutting down on important life activities because of cocaine use.  Use of cocaine over and over in situations when it  is physically hazardous, such as driving a car.  Continued use of cocaine in spite of a physical problem that is likely related to use. Physical problems can include:  Malnutrition.  Nosebleeds.  Chest pain.  High blood pressure.  A hole that develops between the part of your nose  that separates your nostrils (perforated nasal septum).  Lung and kidney damage.  Continued use of cocaine in spite of a mental problem that is likely related to use. Mental problems can include:  Schizophrenia-like symptoms.  Depression.  Bipolar mood swings.  Anxiety.  Sleep problems.  Need to use more and more cocaine to get the same effect, or lessened effect over time with use of the same amount of cocaine (tolerance).  Having withdrawal symptoms when cocaine use is stopped, or using cocaine to reduce or avoid withdrawal symptoms. Withdrawal symptoms include:  Depressed or irritable mood.  Low energy or restlessness.  Bad dreams.  Poor or excessive sleep.  Increased appetite. DIAGNOSIS Stimulant use disorder is diagnosed by your health care provider. You may be asked questions about your cocaine use and how it affects your life. A physical exam may be done. A drug screen may be ordered. You may be referred to a mental health professional. The diagnosis of stimulant use disorder requires at least two symptoms within 12 months. The type of stimulant use disorder depends on the number of signs and symptoms you have. The type may be:  Mild. Two or three signs and symptoms.  Moderate. Four or five signs and symptoms.  Severe. Six or more signs and symptoms. TREATMENT Treatment for stimulant use disorder is usually provided by mental health professionals with training in substance use disorders. The following options are available:  Counseling or talk therapy. Talk therapy addresses the reasons you use cocaine and ways to keep you from using again. Goals of talk therapy include:  Identifying and avoiding triggers for use.  Handling cravings.  Replacing use with healthy activities.  Support groups. Support groups provide emotional support, advice, and guidance.  Medicine. Certain medicines may decrease cocaine cravings or withdrawal symptoms. HOME CARE  INSTRUCTIONS  Take medicines only as directed by your health care provider.  Identify the people and activities that trigger your cocaine use and avoid them.  Keep all follow-up visits as directed by your health care provider. SEEK MEDICAL CARE IF:  Your symptoms get worse or you relapse.  You are not able to take medicines as directed. SEEK IMMEDIATE MEDICAL CARE IF:  You have serious thoughts about hurting yourself or others.  You have a seizure, chest pain, sudden weakness, or loss of speech or vision. FOR MORE INFORMATION  National Institute on Drug Abuse: http://www.price-smith.com/  Substance Abuse and Mental Health Services Administration: SkateOasis.com.pt Document Released: 03/27/2000 Document Revised: 08/14/2013 Document Reviewed: 04/12/2013 [facility] Patient Information 2015 [place], [place]. This information is not intended to replace advice given to you by your health care provider. Make sure you discuss any questions you have with your health care provider.      Emergency Department Resource Guide 1) Find a Doctor and Pay Out of Pocket Although you won't have to find out who is covered by your insurance plan, it is a good idea to ask around and get recommendations. You will then need to call the office and see if the doctor you have chosen will accept you as a new patient and what types of options they offer for patients who are self-pay. Some doctors offer discounts or  will set up payment plans for their patients who do not have insurance, but you will need to ask so you aren't surprised when you get to your appointment.  2) Contact Your Local Health Department Not all health departments have doctors that can see patients for sick visits, but many do, so it is worth a call to see if yours does. If you don't know where your local health department is, you can check in your phone book. The CDC also has a tool to help you locate your state's health department, and many state websites  also have listings of all of their local health departments.  3) Find a Walk-in Clinic If your illness is not likely to be very severe or complicated, you may want to try a walk in clinic. These are popping up all over the country in pharmacies, drugstores, and shopping centers. They're usually staffed by nurse practitioners or physician assistants that have been trained to treat common illnesses and complaints. They're usually fairly quick and inexpensive. However, if you have serious medical issues or chronic medical problems, these are probably not your best option.  No Primary Care Doctor: - Call Health Connect at  [phone] - they can help you locate a primary care doctor that  accepts your insurance, provides certain services, etc. - Physician Referral Service- [phone]  Chronic Pain Problems: Organization         Address  Phone   Notes  [facility]  [phone] Patients need to be referred by their primary care doctor.   Medication Assistance: Organization         Address  Phone   Notes  [facility] Medication [facility] [address] [place] [place], [place] [address] [phone] --Must be a resident of [facility] -- Must have NO insurance coverage whatsoever (no Medicaid/ Medicare, etc.) -- The pt. MUST have a primary care doctor that directs their care regularly and follows them in the community   [facility]ist  [phone]   [employer]  [phone]    Agencies that provide inexpensive medical care: Organization         Address  Phone   Notes  [facility]  [phone]   [facility]    [phone]   [facility] [address] [place], [place] [address] [phone]   [facility] [address] [place] [place] [phone]   Planned Parenthood    [phone]   [facility]    [phone]   [facility]   [address], [place] Phone:  [phone], Fax:  [phone] Hours of Operation:  9 am - 6 pm, M-F.  Also accepts Medicaid/Medicare and self-pay.  [facility]  [address], [place] Phone: [phone], Fax: [phone]. Hours of Operation:  8:30 am - 5:30 pm, M-F.  Also accepts Medicaid and self-pay.  [facility] [place] [address], [place] Phone: [phone]   [facility] [address] Natasha Bence [place], [place] [phone], Ext. [phone] Mondays & Thursdays: 7-9 AM.  First 15 patients are seen on a first come, first serve basis.    Medicaid-accepting [facility] Providers:  Organization         Address  Phone   Notes  [facility] [address], [place] [phone])  [phone] Also accepts self-pay patients.  [facility] [address] Laurell Josephs [place], [place]  [phone]   [facility] [address], [place] [phone]   [facility] [address] [place] [phone]   Renaye Rakers [address], [place]   [phone] Only accepts [place] Access [place] patients after they have their name applied to their card.   Self-Pay (no insurance) in [facility]:  Organization         Address  Phone   Notes  Sickle Cell Patients, [facility] Internal Medicine [address] [place], [place] [phone]   [facility] [address] [place], [place] [phone]   [facility] [place]  [address] [place] [address], [place] [phone]   [facility]/Dr. Osei-Bonsu  [address] [place] or [address], [place] [phone] Phone number for both [place] and [place] locations is the same.  [facility]gent Medical and [facility] [address] [place] [phone]   [facility] [address],  [place] or [address] [phone]   [facility] [address], [place] [phone], phone; [phone], fax Sees patients 1st and 3rd Saturday of every month.  Must not qualify for public or private insurance (i.e. Medicaid, Medicare, [place] Health Choice, Veterans' Benefits)  Household income should be no more than 200% of the poverty level The clinic cannot treat you if you are pregnant or think you are pregnant  Sexually transmitted diseases are not treated at the clinic.    Dental Care: Organization         Address  Phone  Notes  [facility] Department of [facility] [address] [place], [place] [phone] Accepts children up to age 75 who are enrolled in [place] or [place] Health Choice; pregnant women with a Medicaid card; and children who have applied for Medicaid or [place] Health Choice, but were declined, whose parents can pay a reduced fee at time of service.  [facility] Department of [facility]  [address], [place] [phone] Accepts children up to age 39 who are enrolled in [place] or [place] Health Choice; pregnant women with a Medicaid card; and children who have applied for Medicaid or [place] Health Choice, but were declined, whose parents can pay a reduced fee at time of service.  [facility]  [address] [place], [place] [phone] Patients are seen by appointment only. Walk-ins are not accepted. [facility] will see patients 36 years of age and older. Monday - Tuesday (8am-5pm) Most Wednesdays (8:30-5pm) $30 per visit, cash only  [facility] Adult Dental Access PRO[facility]  [address], [facility] [phone] Patients are seen by appointment only. Walk-ins are not accepted. [facility] will see patients 45 years of age and older. One Wednesday Evening (Monthly: Volunteer Based).  $30 per visit, cash only  [employer]  [phone] for adults; Children under age 4, call Graduate Pediatric Dentistry at [phone]. Children aged 23-14, please call [phone] to request a pediatric application.  Dental services are provided in all areas of dental care including fillings, crowns and bridges, complete and partial dentures, implants, gum treatment, root canals, and extractions. Preventive care is also provided.  Treatment is provided to both adults and children. Patients are selected via a lottery and there is often a waiting list.   [facility] [address] [place]  [phone] [email or web address]   [facility] [address] [place], [place] [phone], Ext. [phone] Second and Fourth Thursday of each month, opens at 6:30 AM; Clinic ends at 9 AM.  Patients are seen on a first-come first-served basis, and a limited number are seen during each clinic.   [facility]  [address] Ether Griffins [place], [place] [phone]   Eligibility Requirements You must have lived in [place], [place], or [place] counties for at least the last three months.   You cannot be eligible for state or federal sponsored [place], including [employer], [place], or [employer].   You generally cannot be eligible for healthcare insurance through your employer.    How to apply: Eligibility screenings are held every Tuesday and Wednesday afternoon from 1:00 pm until 4:00 pm. You do not need an appointment for the interview!  [facility] [address], [place], [place] [phone]   [facility] Health Department  [phone]   [facility] Health Department  [phone]   [facility] Health Department  [phone]    Behavioral Health Resources in the Community: Intensive Outpatient Programs Organization         Address  Phone  Notes  [facility] [address] [place], [place] [phone]     [facility] [address] [place], [place] [phone]   [facility] [address], [place], [place]  [phone]   [facility] Health [address]  [place], [place] [phone] or [phone]   Substance Abuse Resources Organization         Address  Phone  Notes  [facility]  [phone]   [facility]  [phone]   [facility]  [phone]   Floydene Flock  [phone]   Residential & Outpatient Substance Abuse Program  [phone]   Psychological Services Organization         Address  Phone  Notes  [facility] Behavioral Health  [phone]   [facility] Services  [phone]   [facility] Mental Health [address] [place] [phone] or [phone]    Mobile Crisis Teams Organization         Address  Phone  Notes  [facility], Mobile Crisis Care Unit  [phone]   [facility]  [address] [place], [place] [phone]   Doristine Locks [address] [place] [phone]    Self-Help/Support Groups Organization         Address  Phone             Notes  [facility] [place] - variety of support groups  [phone]- [phone] Call for more information  Narcotics Anonymous (NA), [facility] [address], [employer] [place]  2 meetings at this location   Statistician         Address  Phone  Notes  [facility] [address],    [place]  [phone]   [facility]  [address] [place] [address], [place], [place] [phone]   [facility] [address] [place], [place] [phone] Admissions: 8am-3pm M-F  [facility] [address]    [place], [place] [phone]   [facility] [address] Starling Manns [place], [place] [phone]   [facility] [address],  [place],  [place] [phone]   [facility] - Intensive Outpatient [address] Laurell Josephs [address], [place], [place] [phone]   [facility] (Addiction [facility] [address] [place]  [place], [place] [phone] or [phone]   [facility] ([facility]) [address] [place], [place] [phone] Accepts Medicaid  [facility] [address]  [place] [phone] Substance Abuse/Addiction Treatment   [facility] Organization         Address  Phone  Notes  [facility]  [phone]   Angie Fava, PhD [address] Ervin Knack [place], [place]   [phone] or [phone]   [facility]   [address] [place], [place] [phone]   [facility] [address], [place], [place] [phone] Insurance/Medicaid/sponsorship through [facility] [address]                                    [place], [place] [phone] Therapy/tele-psych/case  [facility] [address] [place], [place] [phone]    Dr. Lolly Mustache  [phone]   [facility]  [employer] [facility] Dept. 1) [address] [place] 2) [address], [place] 3)  [address], [place] [phone]  [phone]   [facility] Child Abuse Hotline 915-610-4845 or [phone] (After Hours)

## 2014-12-06 NOTE — ED Provider Notes (Signed)
CSN: [id number]     Arrival date & time 12/06/14  1525 History   First MD Initiated Contact with Patient 12/06/14 1722     Chief Complaint  Patient presents with  . Drug Problem  . Alcohol Problem     (Consider location/radiation/quality/duration/timing/severity/associated sxs/prior Treatment) Patient is a 44 y.o. male presenting with drug problem and alcohol problem. The history is provided by the patient.  Drug Problem Pertinent negatives include no chest pain, no abdominal pain, no headaches and no shortness of breath.  Alcohol Problem Pertinent negatives include no chest pain, no abdominal pain, no headaches and no shortness of breath.  Patient states using etoh for the past several years, and binge drinking intermittently. Crack cocaine use since his 20s. No hx seizures, dts, or complicated etoh withdrawal. No prescription med use, no bzd use. States physical health at baseline. No recent illness or other acute symptoms.  Denies severe depression, no thoughts of suicide.  Eating and drinking normally, normal appetite. Last detox/tx for substance abuse 1999, but states he left, didn't pursue.      History reviewed. No pertinent past medical history. History reviewed. No pertinent past surgical history. No family history on file. Social History  Substance Use Topics  . Smoking status: Current Every Day Smoker    Types: Cigarettes  . Smokeless tobacco: None  . Alcohol Use: Yes    Review of Systems  Constitutional: Negative for fever.  HENT: Negative for sore throat.   Eyes: Negative for redness.  Respiratory: Negative for shortness of breath.   Cardiovascular: Negative for chest pain.  Gastrointestinal: Negative for vomiting and abdominal pain.  Genitourinary: Negative for flank pain.  Musculoskeletal: Negative for back pain and neck pain.  Skin: Negative for rash.  Neurological: Negative for headaches.  Hematological: Does not bruise/bleed easily.   Psychiatric/Behavioral: Negative for suicidal ideas.      Allergies  Review of patient's allergies indicates no known allergies.  Home Medications   Prior to Admission medications   Medication Sig Start Date End Date Taking? Authorizing Provider  HYDROcodone-acetaminophen (NORCO/VICODIN) 5-325 MG per tablet Take 1 tablet by mouth every 6 (six) hours as needed for moderate pain. Patient not taking: Reported on 02/28/2014 05/30/13   Azalia Bilis, MD  penicillin v potassium (VEETID) 500 MG tablet Take 1 tablet (500 mg total) by mouth 4 (four) times daily. Patient not taking: Reported on 02/28/2014 05/30/13   Azalia Bilis, MD   BP 135/77 mmHg  Pulse 105  Temp(Src) 98.3 F (36.8 C) (Oral)  Resp 14  SpO2 96% Physical Exam  Constitutional: He is oriented to person, place, and time. He appears well-developed and well-nourished. No distress.  HENT:  Head: Atraumatic.  Mouth/Throat: Oropharynx is clear and moist.  Eyes: Conjunctivae are normal. Pupils are equal, round, and reactive to light. No scleral icterus.  Neck: Neck supple. No tracheal deviation present.  Cardiovascular: Normal rate, regular rhythm, normal heart sounds and intact distal pulses.   Pulmonary/Chest: Effort normal and breath sounds normal. No accessory muscle usage. No respiratory distress.  Abdominal: Soft. He exhibits no distension. There is no tenderness.  Musculoskeletal: Normal range of motion.  Neurological: He is alert and oriented to person, place, and time.  No tremor or shakes. Steady gait.   Skin: Skin is warm and dry.  Psychiatric: He has a normal mood and affect.  No SI.  Nursing note and vitals reviewed.   ED Course  Procedures (including critical care time) Labs Review  Results for  orders placed or performed during the hospital encounter of 12/06/14  Comprehensive metabolic panel  Result Value Ref Range   Sodium 140 135 - 145 mmol/L   Potassium 3.8 3.5 - 5.1 mmol/L   Chloride 105 101 - 111  mmol/L   CO2 25 22 - 32 mmol/L   Glucose, Bld 96 65 - 99 mg/dL   BUN 8 6 - 20 mg/dL   Creatinine, Ser 1.61 (H) 0.61 - 1.24 mg/dL   Calcium 8.6 (L) 8.9 - 10.3 mg/dL   Total Protein 5.8 (L) 6.5 - 8.1 g/dL   Albumin 3.5 3.5 - 5.0 g/dL   AST 23 15 - 41 U/L   ALT 20 17 - 63 U/L   Alkaline Phosphatase 60 38 - 126 U/L   Total Bilirubin 0.8 0.3 - 1.2 mg/dL   GFR calc non Af Amer >60 >60 mL/min   GFR calc Af Amer >60 >60 mL/min   Anion gap 10 5 - 15  Ethanol (ETOH)  Result Value Ref Range   Alcohol, Ethyl (B) <5 <5 mg/dL  Salicylate level  Result Value Ref Range   Salicylate Lvl <4.0 2.8 - 30.0 mg/dL  Acetaminophen level  Result Value Ref Range   Acetaminophen (Tylenol), Serum <10 (L) 10 - 30 ug/mL  CBC  Result Value Ref Range   WBC 7.9 4.0 - 10.5 K/uL   RBC 5.16 4.22 - 5.81 MIL/uL   Hemoglobin 15.7 13.0 - 17.0 g/dL   HCT 09.6 04.5 - 40.9 %   MCV 90.5 78.0 - 100.0 fL   MCH 30.4 26.0 - 34.0 pg   MCHC 33.6 30.0 - 36.0 g/dL   RDW 81.1 91.4 - 78.2 %   Platelets 293 150 - 400 K/uL        I have personally reviewed and evaluated these images and lab results as part of my medical decision-making.      MDM   Labs sent from triage.  Reviewed nursing notes and prior charts for additional history.   Discussed w pt, will provide resource guide for community substance abuse.  etoh 0. No tremor or shakes. No nv. Vitals normal.      Cathren Laine, MD 12/06/14 1728

## 2016-02-25 ENCOUNTER — Encounter (HOSPITAL_COMMUNITY): Payer: Self-pay

## 2016-02-25 ENCOUNTER — Emergency Department (HOSPITAL_COMMUNITY)
Admission: EM | Admit: 2016-02-25 | Discharge: 2016-02-26 | Disposition: A | Discharge status: Other Acute Inpatient Hospital | Payer: Self-pay | Attending: Emergency Medicine | Admitting: Emergency Medicine

## 2016-02-25 DIAGNOSIS — F329 Major depressive disorder, single episode, unspecified: Secondary | ICD-10-CM | POA: Insufficient documentation

## 2016-02-25 DIAGNOSIS — T2006XA Burn of unspecified degree of forehead and cheek, initial encounter: Secondary | ICD-10-CM | POA: Insufficient documentation

## 2016-02-25 DIAGNOSIS — L03311 Cellulitis of abdominal wall: Secondary | ICD-10-CM

## 2016-02-25 DIAGNOSIS — X778XXA Intentional self-harm by other hot objects, initial encounter: Secondary | ICD-10-CM | POA: Insufficient documentation

## 2016-02-25 DIAGNOSIS — F1410 Cocaine abuse, uncomplicated: Secondary | ICD-10-CM | POA: Insufficient documentation

## 2016-02-25 DIAGNOSIS — Y929 Unspecified place or not applicable: Secondary | ICD-10-CM | POA: Insufficient documentation

## 2016-02-25 DIAGNOSIS — Y999 Unspecified external cause status: Secondary | ICD-10-CM | POA: Insufficient documentation

## 2016-02-25 DIAGNOSIS — F1010 Alcohol abuse, uncomplicated: Secondary | ICD-10-CM | POA: Insufficient documentation

## 2016-02-25 DIAGNOSIS — T2102XA Burn of unspecified degree of abdominal wall, initial encounter: Secondary | ICD-10-CM | POA: Insufficient documentation

## 2016-02-25 DIAGNOSIS — F17210 Nicotine dependence, cigarettes, uncomplicated: Secondary | ICD-10-CM | POA: Insufficient documentation

## 2016-02-25 DIAGNOSIS — Y939 Activity, unspecified: Secondary | ICD-10-CM | POA: Insufficient documentation

## 2016-02-25 DIAGNOSIS — Z79899 Other long term (current) drug therapy: Secondary | ICD-10-CM | POA: Insufficient documentation

## 2016-02-25 DIAGNOSIS — F32A Depression, unspecified: Secondary | ICD-10-CM

## 2016-02-25 DIAGNOSIS — T300 Burn of unspecified body region, unspecified degree: Secondary | ICD-10-CM

## 2016-02-25 LAB — CBC WITH DIFFERENTIAL/PLATELET
BASOS PCT: 1 %
Basophils Absolute: 0 10*3/uL (ref 0.0–0.1)
EOS PCT: 3 %
Eosinophils Absolute: 0.2 10*3/uL (ref 0.0–0.7)
HEMATOCRIT: 47 % (ref 39.0–52.0)
Hemoglobin: 16.2 g/dL (ref 13.0–17.0)
Lymphocytes Relative: 25 %
Lymphs Abs: 1.8 10*3/uL (ref 0.7–4.0)
MCH: 30.6 pg (ref 26.0–34.0)
MCHC: 34.5 g/dL (ref 30.0–36.0)
MCV: 88.7 fL (ref 78.0–100.0)
MONO ABS: 0.7 10*3/uL (ref 0.1–1.0)
MONOS PCT: 10 %
NEUTROS ABS: 4.5 10*3/uL (ref 1.7–7.7)
Neutrophils Relative %: 61 %
Platelets: 277 10*3/uL (ref 150–400)
RBC: 5.3 MIL/uL (ref 4.22–5.81)
RDW: 13.6 % (ref 11.5–15.5)
WBC: 7.3 10*3/uL (ref 4.0–10.5)

## 2016-02-25 LAB — COMPREHENSIVE METABOLIC PANEL
ALBUMIN: 3.6 g/dL (ref 3.5–5.0)
ALK PHOS: 86 U/L (ref 38–126)
ALT: 25 U/L (ref 17–63)
AST: 29 U/L (ref 15–41)
Anion gap: 7 (ref 5–15)
BILIRUBIN TOTAL: 0.6 mg/dL (ref 0.3–1.2)
CALCIUM: 9 mg/dL (ref 8.9–10.3)
CO2: 27 mmol/L (ref 22–32)
CREATININE: 1.12 mg/dL (ref 0.61–1.24)
Chloride: 106 mmol/L (ref 101–111)
GFR calc Af Amer: >60 mL/min (ref >60)
GFR calc non Af Amer: >60 mL/min (ref >60)
GLUCOSE: 103 mg/dL — AB (ref 65–99)
POTASSIUM: 4 mmol/L (ref 3.5–5.1)
Sodium: 140 mmol/L (ref 135–145)
TOTAL PROTEIN: 6.4 g/dL — AB (ref 6.5–8.1)

## 2016-02-25 LAB — URINALYSIS, ROUTINE W REFLEX MICROSCOPIC
Bilirubin Urine: NEGATIVE
Glucose, UA: NEGATIVE mg/dL
Hgb urine dipstick: NEGATIVE
Ketones, ur: NEGATIVE mg/dL
LEUKOCYTES UA: NEGATIVE
Nitrite: NEGATIVE
PROTEIN: NEGATIVE mg/dL
Specific Gravity, Urine: 1.018 (ref 1.005–1.030)
pH: 5.5 (ref 5.0–8.0)

## 2016-02-25 LAB — RAPID URINE DRUG SCREEN, HOSP PERFORMED
Amphetamines: NOT DETECTED
BARBITURATES: NOT DETECTED
Benzodiazepines: NOT DETECTED
COCAINE: POSITIVE — AB
Opiates: NOT DETECTED
Tetrahydrocannabinol: NOT DETECTED

## 2016-02-25 LAB — ETHANOL: Alcohol, Ethyl (B): <5 mg/dL (ref <5)

## 2016-02-25 MED ORDER — SULFAMETHOXAZOLE-TRIMETHOPRIM 800-160 MG PO TABS
1.0000 | ORAL_TABLET | Freq: Two times a day (BID) | ORAL | Status: DC
  Administered 2016-02-25 – 2016-02-26 (×3): 1 via ORAL
  Filled 2016-02-25 (×3): qty 1

## 2016-02-25 MED ORDER — SILVER SULFADIAZINE 1 % EX CREA
TOPICAL_CREAM | Freq: Once | CUTANEOUS | Status: AC
  Administered 2016-02-25: 1 via TOPICAL
  Filled 2016-02-25: qty 85

## 2016-02-25 MED ORDER — ONDANSETRON HCL 4 MG PO TABS: 4.0000 mg | ORAL_TABLET | Freq: Three times a day (TID) | ORAL | Status: DC | PRN

## 2016-02-25 MED ORDER — BACITRACIN ZINC 500 UNIT/GM EX OINT
TOPICAL_OINTMENT | Freq: Once | CUTANEOUS | Status: AC
  Administered 2016-02-25: 2 via TOPICAL
  Filled 2016-02-25: qty 1.8

## 2016-02-25 MED ORDER — IBUPROFEN 200 MG PO TABS
600.0000 mg | ORAL_TABLET | Freq: Three times a day (TID) | ORAL | Status: DC | PRN
  Administered 2016-02-26: 600 mg via ORAL
  Filled 2016-02-25: qty 1

## 2016-02-25 MED ORDER — ALUM & MAG HYDROXIDE-SIMETH 200-200-20 MG/5ML PO SUSP: 30.0000 mL | ORAL | Status: DC | PRN

## 2016-02-25 MED ORDER — SILVER SULFADIAZINE 1 % EX CREA: TOPICAL_CREAM | Freq: Two times a day (BID) | CUTANEOUS | Status: DC

## 2016-02-25 MED ORDER — THIAMINE HCL 100 MG/ML IJ SOLN
100.0000 mg | Freq: Every day | INTRAMUSCULAR | Status: DC
Start: 2016-02-25 — End: 2016-02-26

## 2016-02-25 MED ORDER — VITAMIN B-1 100 MG PO TABS
100.0000 mg | ORAL_TABLET | Freq: Every day | ORAL | Status: DC
Start: 2016-02-25 — End: 2016-02-26
  Administered 2016-02-25 – 2016-02-26 (×2): 100 mg via ORAL
  Filled 2016-02-25 (×2): qty 1

## 2016-02-25 MED ORDER — ACETAMINOPHEN 325 MG PO TABS: 650.0000 mg | ORAL_TABLET | ORAL | Status: DC | PRN

## 2016-02-25 MED ORDER — LORAZEPAM 0.5 MG PO TABS
0.0000 mg | ORAL_TABLET | Freq: Four times a day (QID) | ORAL | Status: DC
  Administered 2016-02-25: 1 mg via ORAL
  Filled 2016-02-25: qty 2

## 2016-02-25 MED ORDER — BACITRACIN ZINC 500 UNIT/GM EX OINT
TOPICAL_OINTMENT | Freq: Two times a day (BID) | CUTANEOUS | Status: DC
  Administered 2016-02-25 – 2016-02-26 (×2): 1 via TOPICAL
  Filled 2016-02-25: qty 0.9

## 2016-02-25 MED ORDER — NICOTINE 21 MG/24HR TD PT24
21.0000 mg | MEDICATED_PATCH | Freq: Every day | TRANSDERMAL | Status: DC
  Administered 2016-02-25 – 2016-02-26 (×2): 21 mg via TRANSDERMAL
  Filled 2016-02-25 (×2): qty 1

## 2016-02-25 MED ORDER — LORAZEPAM 0.5 MG PO TABS: 0.0000 mg | ORAL_TABLET | Freq: Two times a day (BID) | ORAL | Status: DC

## 2016-02-25 NOTE — ED Notes (Signed)
TTS in process 

## 2016-02-25 NOTE — ED Notes (Signed)
Pt requested to go outside and smoke. Nurse provided alternative of applying the nicotine patch refused earlier in shift. Patch applied.
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
## 2016-02-25 NOTE — ED Notes (Signed)
Pt ambulatory to C21 w/RN - 3 labeled belongings bags and green/orange small backpack placed at nurses' desk for inventory. Pt signed Medical Clearance Pt policy form - voiced understanding. Aware waiting for TTS. Staffing Office aware pt now in C21.

## 2016-02-25 NOTE — ED Notes (Signed)
Pt up to phone 

## 2016-02-25 NOTE — ED Notes (Signed)
Pt c/o feeling like he was unable to breathe and being hot. Pt up too bathroom to put water on his face. Bacitracin also applied to skin.

## 2016-02-25 NOTE — ED Notes (Signed)
Supper tray ordered for pt 

## 2016-02-26 ENCOUNTER — Inpatient Hospital Stay
Admission: EM | Admit: 2016-02-26 | Discharge: 2016-03-03 | DRG: 885 | Disposition: A | Discharge status: Home / Self Care | Payer: No Typology Code available for payment source | Source: Intra-hospital | Attending: Psychiatry | Admitting: Psychiatry

## 2016-02-26 ENCOUNTER — Encounter: Payer: Self-pay | Admitting: Psychiatry

## 2016-02-26 DIAGNOSIS — Z810 Family history of intellectual disabilities: Secondary | ICD-10-CM | POA: Diagnosis not present

## 2016-02-26 DIAGNOSIS — K649 Unspecified hemorrhoids: Secondary | ICD-10-CM | POA: Diagnosis present

## 2016-02-26 DIAGNOSIS — F1290 Cannabis use, unspecified, uncomplicated: Secondary | ICD-10-CM | POA: Diagnosis present

## 2016-02-26 DIAGNOSIS — T2000XA Burn of unspecified degree of head, face, and neck, unspecified site, initial encounter: Secondary | ICD-10-CM | POA: Diagnosis present

## 2016-02-26 DIAGNOSIS — G4710 Hypersomnia, unspecified: Secondary | ICD-10-CM | POA: Diagnosis present

## 2016-02-26 DIAGNOSIS — F332 Major depressive disorder, recurrent severe without psychotic features: Principal | ICD-10-CM

## 2016-02-26 DIAGNOSIS — Z8701 Personal history of pneumonia (recurrent): Secondary | ICD-10-CM | POA: Diagnosis not present

## 2016-02-26 DIAGNOSIS — F17210 Nicotine dependence, cigarettes, uncomplicated: Secondary | ICD-10-CM | POA: Diagnosis present

## 2016-02-26 DIAGNOSIS — Z818 Family history of other mental and behavioral disorders: Secondary | ICD-10-CM | POA: Diagnosis not present

## 2016-02-26 DIAGNOSIS — R4587 Impulsiveness: Secondary | ICD-10-CM | POA: Diagnosis present

## 2016-02-26 DIAGNOSIS — L089 Local infection of the skin and subcutaneous tissue, unspecified: Secondary | ICD-10-CM | POA: Diagnosis present

## 2016-02-26 DIAGNOSIS — G4700 Insomnia, unspecified: Secondary | ICD-10-CM | POA: Diagnosis present

## 2016-02-26 DIAGNOSIS — F1020 Alcohol dependence, uncomplicated: Secondary | ICD-10-CM | POA: Diagnosis present

## 2016-02-26 DIAGNOSIS — X76XXXA Intentional self-harm by smoke, fire and flames, initial encounter: Secondary | ICD-10-CM | POA: Diagnosis not present

## 2016-02-26 DIAGNOSIS — T2102XA Burn of unspecified degree of abdominal wall, initial encounter: Secondary | ICD-10-CM | POA: Diagnosis present

## 2016-02-26 DIAGNOSIS — Z811 Family history of alcohol abuse and dependence: Secondary | ICD-10-CM | POA: Diagnosis not present

## 2016-02-26 DIAGNOSIS — F17200 Nicotine dependence, unspecified, uncomplicated: Secondary | ICD-10-CM | POA: Diagnosis present

## 2016-02-26 DIAGNOSIS — F1420 Cocaine dependence, uncomplicated: Secondary | ICD-10-CM | POA: Diagnosis present

## 2016-02-26 DIAGNOSIS — Y900 Blood alcohol level of less than 20 mg/100 ml: Secondary | ICD-10-CM | POA: Diagnosis present

## 2016-02-26 DIAGNOSIS — Y249XXA Unspecified firearm discharge, undetermined intent, initial encounter: Secondary | ICD-10-CM

## 2016-02-26 HISTORY — DX: Pneumonia, unspecified organism: J18.9

## 2016-02-26 HISTORY — DX: Accidental discharge from unspecified firearms or gun, initial encounter: W34.00XA

## 2016-02-26 MED ORDER — HYDROCORTISONE 1 % EX CREA
TOPICAL_CREAM | Freq: Four times a day (QID) | CUTANEOUS | Status: DC | PRN
  Filled 2016-02-26: qty 28

## 2016-02-26 MED ORDER — NICOTINE 21 MG/24HR TD PT24
21.0000 mg | MEDICATED_PATCH | Freq: Every day | TRANSDERMAL | Status: DC
  Administered 2016-02-27 – 2016-03-03 (×6): 21 mg via TRANSDERMAL
  Filled 2016-02-26 (×6): qty 1

## 2016-02-26 MED ORDER — TRAZODONE HCL 100 MG PO TABS
100.0000 mg | ORAL_TABLET | Freq: Every day | ORAL | Status: DC
  Administered 2016-02-26 – 2016-02-29 (×4): 100 mg via ORAL
  Filled 2016-02-26 (×5): qty 1

## 2016-02-26 MED ORDER — BACITRACIN 500 UNIT/GM EX OINT
TOPICAL_OINTMENT | Freq: Two times a day (BID) | CUTANEOUS | Status: DC
  Administered 2016-02-26 – 2016-02-27 (×2): 1 via TOPICAL
  Filled 2016-02-26 (×2): qty 1

## 2016-02-26 MED ORDER — ACETAMINOPHEN 325 MG PO TABS
650.0000 mg | ORAL_TABLET | Freq: Four times a day (QID) | ORAL | Status: DC | PRN
  Administered 2016-02-27: 650 mg via ORAL
  Filled 2016-02-26: qty 2

## 2016-02-26 MED ORDER — ALUM & MAG HYDROXIDE-SIMETH 200-200-20 MG/5ML PO SUSP: 30.0000 mL | ORAL | Status: DC | PRN

## 2016-02-26 MED ORDER — CHLORDIAZEPOXIDE HCL 25 MG PO CAPS
25.0000 mg | ORAL_CAPSULE | Freq: Four times a day (QID) | ORAL | Status: DC
  Administered 2016-02-26 – 2016-02-27 (×2): 25 mg via ORAL
  Filled 2016-02-26 (×2): qty 1

## 2016-02-26 MED ORDER — HYDROXYZINE HCL 25 MG PO TABS
25.0000 mg | ORAL_TABLET | Freq: Three times a day (TID) | ORAL | Status: DC | PRN
  Administered 2016-02-28 – 2016-02-29 (×2): 25 mg via ORAL
  Filled 2016-02-26 (×2): qty 1

## 2016-02-26 MED ORDER — MAGNESIUM HYDROXIDE 400 MG/5ML PO SUSP: 30.0000 mL | Freq: Every day | ORAL | Status: DC | PRN

## 2016-02-26 MED ORDER — SILVER SULFADIAZINE 1 % EX CREA
TOPICAL_CREAM | Freq: Two times a day (BID) | CUTANEOUS | Status: DC
  Administered 2016-02-26 – 2016-03-03 (×11): via TOPICAL
  Filled 2016-02-26 (×2): qty 85

## 2016-02-26 NOTE — BH Assessment (Addendum)
[facility]  Accepting physician is Dr. Jennet MaduroPucilowska  Attending Physician will be Dr. Ardyth HarpsHernandez.  Patient has been assigned to room 319, [facility] Charge Nurse [place].  Call report to [phone].  Representative/Transfer Coordinator is Warden/rangerCalvin Patient [facility]ad[facility] Byrd Hesselbach(Maria)   [facility] Staff Lillia Abed(Lindsay, A/C) made aware of acceptance.  Dr. Jennet MaduroPucilowska will put admission orders in.

## 2016-02-26 NOTE — Progress Notes (Signed)
Patient requested CSW to his room to inquire about if he is eligible for Medicaid. CSW explained that CSW is unable to determine Medicaid eligibility and that he must go to Department of Social Services in order to determine medicaid eligibility. Patient had no further questions for CSW at this time. This CSW signing off. Please contact if new need(s) arise.          Lance MussAshley Gardner,MSW, LCSW [facility] ED/62M Clinical Social Worker [phone]

## 2016-02-26 NOTE — ED Notes (Signed)
Patient requesting cream for hemorrhoids. RN spoke with EDP who will place new orders.

## 2016-02-26 NOTE — ED Notes (Signed)
Voluntary commitment paperwork to go to [facility] signed by patient and faxed back.

## 2016-02-26 NOTE — ED Notes (Signed)
EDP at bedside  

## 2016-02-26 NOTE — ED Notes (Signed)
Patient was given a snack and drink, and a regular diet was ordered for lunch. 

## 2016-02-26 NOTE — ED Provider Notes (Signed)
Patient has been accepted to [facility] for further treatment of his psychiatric condition. Patient agrees to transfer.  He is medically stable for transfer at this time.    Tilden FossaElizabeth Oren Barella, MD 02/26/16 2015

## 2016-02-26 NOTE — ED Notes (Signed)
Snack Provided.  

## 2016-02-26 NOTE — ED Notes (Signed)
Drsg change completed.

## 2016-02-26 NOTE — ED Notes (Signed)
Received a call from [facility] regarding placement for this patient. Jamesetta Sohyllis RN states they have a bed for patient. Report given to RN.

## 2016-02-26 NOTE — ED Notes (Signed)
Bed at [facility] ready at 8:00pm tonight. Report has been called.

## 2016-02-27 DIAGNOSIS — Y249XXA Unspecified firearm discharge, undetermined intent, initial encounter: Secondary | ICD-10-CM

## 2016-02-27 DIAGNOSIS — F332 Major depressive disorder, recurrent severe without psychotic features: Principal | ICD-10-CM

## 2016-02-27 MED ORDER — ACETAMINOPHEN 500 MG PO TABS: 1000.0000 mg | ORAL_TABLET | Freq: Four times a day (QID) | ORAL | Status: DC | PRN

## 2016-02-27 MED ORDER — FLUOXETINE HCL 10 MG PO CAPS
10.0000 mg | ORAL_CAPSULE | Freq: Every day | ORAL | Status: DC
  Administered 2016-02-27 – 2016-03-01 (×4): 10 mg via ORAL
  Filled 2016-02-27 (×5): qty 1

## 2016-02-27 MED ORDER — IBUPROFEN 600 MG PO TABS
600.0000 mg | ORAL_TABLET | Freq: Four times a day (QID) | ORAL | Status: DC | PRN
  Administered 2016-02-27 – 2016-03-02 (×4): 600 mg via ORAL
  Filled 2016-02-27 (×5): qty 1

## 2016-02-27 MED ORDER — SULFAMETHOXAZOLE-TRIMETHOPRIM 800-160 MG PO TABS
1.0000 | ORAL_TABLET | Freq: Two times a day (BID) | ORAL | Status: DC
  Administered 2016-02-27 – 2016-03-03 (×11): 1 via ORAL
  Filled 2016-02-27 (×12): qty 1

## 2016-02-27 MED ORDER — BACITRACIN 500 UNIT/GM EX OINT
1.0000 "application " | TOPICAL_OINTMENT | Freq: Two times a day (BID) | CUTANEOUS | Status: DC
  Administered 2016-02-27 – 2016-03-03 (×9): 1 via TOPICAL
  Filled 2016-02-27 (×7): qty 0.9

## 2016-02-27 NOTE — BHH Suicide Risk Assessment (Signed)
CSW left voicemail @ [phone] with pt's brother to have mother, Francisco Rose contact CSW when she returns home.     Francisco Rose, MSW, LCSW-A 02/27/2016, 2:38PM
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
## 2016-02-27 NOTE — BHH Group Notes (Signed)
BHH LCSW Group Therapy  02/27/2016 3:01 PM  Type of Therapy:  Group Therapy  Participation Level:  Patient attended group on this date and minimally participated in the group discussion.   Participation Quality:  Attentive  Affect:  Appropriate  Cognitive:  Appropriate  Insight:  None  Engagement in Therapy:  Limited  Modes of Intervention:  Activity, Discussion, Education and Support  Summary of Progress/Problems:Balance in life: Patients will discuss the concept of balance and how it looks and feels to be unbalanced. Pt will identify areas in their life that is unbalanced and ways to become more balanced.    Milea Klink G. Garnette CzechSampson MSW, LCSWA 02/27/2016, 3:02 PM
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
## 2016-02-27 NOTE — Tx Team (Signed)
Initial Treatment Plan 02/27/2016 4:51 AM Hector BrunswickMickey Colwell WJX:[id number]    PATIENT STRESSORS: Financial difficulties Marital or family conflict Substance abuse   PATIENT STRENGTHS: Average or above average intelligence Communication skills Motivation for treatment/growth   PATIENT IDENTIFIED PROBLEMS: Self-Harm  Substance abuse                   DISCHARGE CRITERIA:  Ability to meet basic life and health needs Improved stabilization in mood, thinking, and/or behavior Motivation to continue treatment in a less acute level of care  PRELIMINARY DISCHARGE PLAN: Attend 12-step recovery group Outpatient therapy Return to previous living arrangement  PATIENT/FAMILY INVOLVEMENT: This treatment plan has been presented to and reviewed with the patient, Hector BrunswickMickey Oloughlin.  The patient and family have been given the opportunity to ask questions and make suggestions.  Rockie NeighboursLuke B Dmiyah Liscano, RN 02/27/2016, 4:51 AM

## 2016-02-27 NOTE — Plan of Care (Signed)
Problem: Activity: Goal: Interest or engagement in activities will improve Outcome: Progressing Pt was more interactive with peers this evening.

## 2016-02-27 NOTE — Progress Notes (Signed)
Recreation Therapy Notes  INPATIENT RECREATION THERAPY ASSESSMENT  Patient Details Name: Francisco Rose MRN: [id number] DOB: 04/16/1970 Today's Date: 02/27/2016  Patient Stressors: Family, Relationship, Other (Comment) (Family is stressful, but they are not at the same time - there are things the patient said he needs to get over; patient is separated but loves his wife - knows he did things to mess up the marriage; pt is not able to be there for his kids)  Coping Skills:   Isolate, Arguments, Substance Abuse, Avoidance, Music, Other (Comment) (Play games on phone)  Personal Challenges: Anger, Communication, Concentration, Decision-Making, Expressing Yourself, Relationships, Self-Esteem/Confidence, Social Interaction, Stress Management, Substance Abuse, Time Management, Trusting Others  Leisure Interests (2+):  Individual - Other (Comment) (Doing hair, girls)  Awareness of Community Resources:  Yes  Community Resources:  [place] [place], [place], Other (Comment) [employer](Community meetings)  Current Use: No  If no, Barriers?: Other (Comment) (Doesn't want to be religious; tries to get money to get high)  Patient Strengths:  Determined, honest  Patient Identified Areas of Improvement:  Depression, be a better father  Current Recreation Participation:  Playing games  Patient Goal for Hospitalization:  To get out  [place]  [place]idence[place]   Current SI (including self-harm):  No  Current HI:  No  Consent to Intern Participation: N/A   Jacquelynn CreeGreene,Makylie Rivere M, LRT/CTRS 02/27/2016, 4:41 PM

## 2016-02-27 NOTE — Plan of Care (Signed)
Problem: Skin Integrity: Goal: Risk for impaired skin integrity will decrease Outcome: Progressing Patient keeping wounds clean and dry. Treatment provided by Clinical research associatewriter.
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
## 2016-02-27 NOTE — Progress Notes (Signed)
Recreation Therapy Notes  Date: 11.16.17 Time: 1:00 pm Location: Craft Room  Group Topic: Leisure Education  Goal Area(s) Addresses:  Patient will ask peer questions related to leisure activities. Patient will verbalize benefit of using leisure as a coping skill.  Behavioral Response: Did not attend  Intervention: Leisure Interview  Activity: Patients were paired up and given Leisure Interview worksheets. Patients were instructed to interview each other on leisure activities.  Education: LRT educated patient on what they need to participate in leisure.  Education Outcome: Patient did not attend group.  Clinical Observations/Feedback: Patient did not attend group.  Markcus Lazenby M, LRT/CTRS 02/27/2016 4:03 PM 
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
## 2016-02-28 MED ORDER — PHENYLEPH-SHARK LIV OIL-MO-PET 0.25-3-14-71.9 % RE OINT
TOPICAL_OINTMENT | Freq: Two times a day (BID) | RECTAL | Status: DC
  Administered 2016-02-28: 1 via RECTAL
  Administered 2016-02-29 – 2016-03-02 (×5): via RECTAL
  Filled 2016-02-28: qty 28.4

## 2016-02-28 MED ORDER — PHENYLEPH-SHARK LIV OIL-MO-PET 0.25-3-14-71.9 % RE OINT: TOPICAL_OINTMENT | Freq: Two times a day (BID) | RECTAL | Status: DC | PRN
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
## 2016-02-28 NOTE — Plan of Care (Signed)
Problem: Safety: Goal: Ability to remain free from injury will improve Outcome: Progressing 15 minute checks maintained for safety, clinical and moral support provided, patient encouraged to continue to express feelings and demonstrate safe care. Patient remains free from harm, will continue to monitor.      
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
## 2016-02-28 NOTE — Plan of Care (Signed)
Problem: [facility] Participation in Recreation Therapeutic Interventions Goal: STG-Patient will demonstrate improved self esteem by identif STG: Self-Esteem - Within 4 treatment sessions, patient will verbalize at least 5 positive affirmation statements in each of 2 treatment sessions to increase self-esteem post d/c.  Outcome: Progressing Treatment Session 1; Completed 1 out of 2: At approximately 2:30 pm, LRT met with patient in craft room. Patient verbalized 5 positive affirmation statements. Patient reported it felt "good". LRT encouraged patient to continue saying positive affirmation statements.  Leonette Monarch, LRT/CTRS 11.17.17 3:38 pm Goal: STG-Other Recreation Therapy Goal (Specify) STG: Stress Management - Within 4 treatment sessions, patient will verbalize understanding of the stress management techniques in each of 2 treatment sessions to increase stress management skills post d/c.  Outcome: Progressing Treatment Session 1; Completed 1 out of 1: At approximately 2:30 pm, LRT met with patient in craft room. LRT educated and provided patient with handouts on stress management techniques. Patient verbalized understanding. LRT encouraged patient to read over and practice the stress management techniques.  Leonette Monarch, LRT/CTRS 11.17.17 3:41 pm

## 2016-02-28 NOTE — Plan of Care (Signed)
Problem: Coping: Goal: Ability to cope will improve Outcome: Progressing Patient developing coping skills. Requesting long term rehab for substance abuse.

## 2016-02-28 NOTE — [facility] Group Notes (Signed)
[facility] LCSW Group Therapy  02/28/2016 4:11 PM  Type of Therapy:  Group Therapy  Participation Level:  Active  Participation Quality:  Attentive  Affect:  Depressed  Cognitive:  Alert  Insight:  Limited  Engagement in Therapy:  Limited  Modes of Intervention:  Activity, Discussion, Education and Support  Summary of Progress/Problems: Feelings around Relapse. Group members discussed the meaning of relapse and shared personal stories of relapse, how it affected them and others, and how they perceived themselves during this time. Group members were encouraged to identify triggers, warning signs and coping skills used when facing the possibility of relapse. Social supports were discussed and explored in detail. Patients also discussed facing disappointment and how that can trigger someone to relapse. Patient discussed utilizing coping skills to manage his stress and depression. CSW provided support to patient.   Adalia Pettis G. Garnette CzechSampson MSW, LCSWA 02/28/2016, 4:14 PM

## 2016-02-28 NOTE — Progress Notes (Signed)
Recreation Therapy Notes  Date: 11.17.17 Time: 1:00 pm Location: Craft Room  Group Topic: Social Skills  Goal Area(s) Addresses:  Patient will effectively work with peer towards shared goal. Patient will identify skills used to make activity successful. Patient will identify benefit of using group skills effectively post d/c.  Behavioral Response: Attentive, Interactive  Intervention: [employer] Tower  Activity: Patients were given 15 pipe cleaners and were instructed to build a free standing tower. Patients were given 2 minutes to strategize. After about 5 minutes of building, patients were told to put their dominant hand behind their back. After about 3 minutes of building, patients were told they could not talk to each other.  Education: LRT educated patients on healthy support systems.  Education Outcome: Acknowledges education/In group clarification offered  Clinical Observations/Feedback: Patient worked with peers to build tower. Patient used effective communication, problem solving, and teamwork skills. Patient contributed to group discussion by stating that his team was successful, what skills they used, why these skills are important, and what prevents him from using these skills.  Jacquelynn CreeGreene,Niyana Chesbro M, LRT/CTRS 02/28/2016 4:07 PM

## 2016-02-28 NOTE — Care Management (Signed)
Francisco GallowayMickey was very pleasant in conversation and did ask for prayer. We spent some time during the consult and talked about his life. He is taking advantage of the groups and mentioned that he thought the Shaune PollackLord [facility] and get help with his depression. He did not desire anything at the time but is fine right now, if he needs anything he will let the staff now.
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
## 2016-02-29 NOTE — [facility] Group Notes (Signed)
[facility] LCSW Group Therapy  02/29/2016 3:53 PM  Type of Therapy:  Group Therapy  Participation Level:  Minimal  Participation Quality:  Attentive  Affect:  Flat  Cognitive:  Alert  Insight:  None  Engagement in Therapy:  Limited  Modes of Intervention:  Activity, Discussion, Education and Support  Summary of Progress/Problems:Self esteem: Patients discussed self esteem and how it impacts them. They discussed what aspects in their lives has influenced their self esteem. They were challenged to identify changes that are needed in order to improve self esteem. Patients participated in activity where they had to identify positive adjectives they felt described their personality. Patients shared with the group on the following areas: Things I am good at, What I like about my appearance, I've helped others by, What I value the most, compliments I have received, challenges I have overcome, thing that make me unique, and Times I've made others happy. Patients also participated in positive socialization with a group game of "Jenga". Patient engaged in the group discussion but was guarded in answering the questions associated with the group activity.  Delyla Sandeen G. Garnette CzechSampson MSW, LCSWA 02/29/2016, 4:03 PM

## 2016-02-29 NOTE — Plan of Care (Signed)
Problem: Coping: Goal: Ability to verbalize frustrations and anger appropriately will improve Outcome: Progressing Patient verbalized frustration to staff.    

## 2016-02-29 NOTE — Plan of Care (Signed)
Problem: Safety: Goal: Ability to remain free from injury will improve Outcome: Progressing Patient has remained free from injury   
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
## 2016-02-29 NOTE — Progress Notes (Signed)
D: Pt denies SI/HI/AVH. Pt is pleasant and cooperative. Pt stated he feels better from taking medications  he appears less anxious and he is interacting with peers and staff appropriately.  A: Pt was offered support and encouragement. Pt was given scheduled medications. Pt was encouraged to attend groups. Q 15 minute checks were done for safety.  R:Pt attends groups and interacts well with peers and staff. Pt is taking medication. Pt has no complaints.Pt receptive to treatment and safety maintained on unit.

## 2016-03-01 MED ORDER — SODIUM CHLORIDE FLUSH 0.9 % IV SOLN
INTRAVENOUS | Status: AC
  Administered 2016-03-01: 10:00:00
  Filled 2016-03-01: qty 9

## 2016-03-01 MED ORDER — TRAZODONE HCL 50 MG PO TABS: 50.0000 mg | ORAL_TABLET | Freq: Every day | ORAL | Status: DC

## 2016-03-01 MED ORDER — TRAZODONE HCL 100 MG PO TABS
100.0000 mg | ORAL_TABLET | Freq: Every day | ORAL | Status: DC
  Administered 2016-03-01 – 2016-03-02 (×2): 100 mg via ORAL
  Filled 2016-03-01 (×3): qty 1

## 2016-03-01 MED ORDER — FLUOXETINE HCL 20 MG PO CAPS
20.0000 mg | ORAL_CAPSULE | Freq: Every day | ORAL | Status: DC
  Administered 2016-03-02 – 2016-03-03 (×2): 20 mg via ORAL
  Filled 2016-03-01 (×3): qty 1
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
## 2016-03-01 NOTE — Progress Notes (Signed)
Pt pleasant during interaction, intrusive at times, brightens on approach, answers questions appropriately, no complaints at this time, denies SI/HI/AVH, needs occasional redirection, going to groups and activities, pt wants to wait for dressing change until next shift, pleasant and cooperative with staff and other patients on the unit, 15 minute checks continued for patient safety.

## 2016-03-01 NOTE — Plan of Care (Signed)
Problem: Coping: Goal: Ability to cope will improve Outcome: Progressing  pt demonstrates coping mechanisms this shifr for depression CTownsend RN

## 2016-03-01 NOTE — Progress Notes (Signed)

## 2016-03-01 NOTE — [facility] Group Notes (Signed)
[facility] LCSW Group Therapy  03/01/2016 1:10 PM  Type of Therapy:  Group Therapy  Participation Level:  Minimal  Participation Quality:  Attentive  Affect:  Appropriate  Cognitive:  Alert  Insight:  Improving  Engagement in Therapy:  Improving  Modes of Intervention:  Activity, Discussion, Education, Reality Testing, Socialization and Support  Summary of Progress/Problems:Safety Planning: Patients identified fears or worries surrounding discharge. Patients offered support to their peers and openly developed safety plans for their individual needs. Patients developed their own safety plan. Patients discussed their warning signs, coping strategies, support system with family and friends, identified mental health professionals, and how to keep their environments safe (ex. Removing unnecessary medications or removing weapons/guns). Patients then discussed their personalized safety plan with the group. Patient opened up more in group topic and stated he needs to re-evaluate certain people in his life who are negatively influencing his decisions. CSW and peers provided support to patient for sharing with the group.   Francisco Rose G. Garnette CzechSampson MSW, LCSWA  03/01/2016, 1:19 PM

## 2016-03-01 NOTE — Progress Notes (Signed)
D:  Per pt self inventory pt reports sleeping good, appetite good, energy level normal, ability to pay attention good, rates depression at a 0 out of 10, hopelessness at a 0 out of 10, anxiety at a 1 out of 10, denies SI/HI/AVH, goal today: "stay around friends, stay out of bed"     A:  Emotional support provided, Encouraged pt to continue with treatment plan and attend all group activities, q15 min checks maintained for safety.  R:  Pt is receptive, going to groups, pleasant and cooperative with staff and other patients on the unit.

## 2016-03-01 NOTE — Progress Notes (Signed)
Patient ID: Francisco BrunswickMickey Bouyer, male   DOB: 1971/03/27, 45 y.o.   MRN: [id number]  CSW faxed Quitline [place] referral for patient. Transmission Log received that fax was successfully sent.   Anna Beaird G. Garnette CzechSampson MSW, LCSWA 03/01/2016 1:46 PM

## 2016-03-02 MED ORDER — SULFAMETHOXAZOLE-TRIMETHOPRIM 800-160 MG PO TABS: 1.0000 | ORAL_TABLET | Freq: Two times a day (BID) | ORAL | 0 refills | Status: AC

## 2016-03-02 MED ORDER — SILVER SULFADIAZINE 1 % EX CREA: TOPICAL_CREAM | Freq: Two times a day (BID) | CUTANEOUS | 0 refills | Status: DC

## 2016-03-02 MED ORDER — TRAZODONE HCL 100 MG PO TABS: 100.0000 mg | ORAL_TABLET | Freq: Every evening | ORAL | 0 refills | Status: DC | PRN

## 2016-03-02 MED ORDER — FLUOXETINE HCL 20 MG PO CAPS: 20.0000 mg | ORAL_CAPSULE | Freq: Every day | ORAL | 0 refills | Status: DC

## 2016-03-02 NOTE — Progress Notes (Signed)
D: Patient is alert and oriented on the unit this shift. Patient attended and actively participated in groups today.Pleasant, happy , interacting with others   Patient denies suicidal ideation, homicidal ideation, auditory or visual hallucinations at the present time.  A: Scheduled medications are administered to patient as per MD orders. Emotional support and encouragement are provided. Patient is maintained on q.15 minute safety checks. Patient is informed to notify staff with questions or concerns. R: No adverse medication reactions are noted. Patient is cooperative with medication administration and treatment plan today. Patient is receptive, calm and cooperative on the unit at this time. Patient interacts well with others on the unit this shift. Patient contracts for safety at this time. Patient remains safe at this time.

## 2016-03-02 NOTE — Progress Notes (Signed)
Recreation Therapy Notes  Date: 11.20.17 Time: 1:00 pm Location: Craft [facility]  Group Topic: Wellness  Goal Area(s) Addresses:  Patient will identify at least one item per dimension of health. Patient will examine areas they are deficient in.  Behavioral Response: Attentive, Interactive  Intervention: 6 Dimensions of Wellness  Activity: Patients were given a definition worksheet and a worksheet with the different dimensions on it. Patients were instructed to write at least one item in each dimension that they were doing before they came to the hospital. LRT encouraged patients to think of 2-3 items.  Education: LRT educated patients on ways to improve each dimension.  Education Outcome: Acknowledges education/In group clarification offered   Clinical Observations/Feedback: Patient at least one item in all but one dimension. Patient contributed to group discussion by stating what areas he was not giving enough attention to, ways he can improve certain dimensions, how this activity relates to his admission, and how this activity relates to his d/c.  Jacquelynn CreeGreene,Elayah Klooster M, LRT/CTRS 03/02/2016 2:17 PM

## 2016-03-02 NOTE — [facility] Group Notes (Signed)
[facility] Group Notes:  (Nursing/MHT/Case Management/Adjunct)  Date:  03/02/2016  Time:  4:31 AM  Type of Therapy:  Group Therapy  Participation Level:  Active  Participation Quality:  Appropriate  Affect:  Appropriate  Cognitive:  Appropriate  Insight:  Appropriate  Engagement in Group:  Supportive  Modes of Intervention:  Discussion  Summary of Progress/Problems: Pt stated that his goal was to socialize with his peers. Pt stated that he did accomplish his goal and feels that rebuilding his support system is a coping skill he wants to continue to use.   Fanny Skatesshley Imani Norinne Jeane 03/02/2016, 4:31 AM

## 2016-03-02 NOTE — [facility] Suicide Risk Assessment (Signed)
[facility] INPATIENT:  Family/Significant Other Suicide Prevention Education  Suicide Prevention Education:  Contact Attempts: Mother, Jerelyn CharlesBetty Pettus @ [phone] has been identified by the patient as the family member/significant other with whom the patient will be residing, and identified as the person(s) who will aid the patient in the event of a mental health crisis.  With written consent from the patient, two attempts were made to provide suicide prevention education, prior to and/or following the patient's discharge.  We were unsuccessful in providing suicide prevention education.  A suicide education pamphlet was given to the patient to share with family/significant other.  Date and time of first attempt: 02/27/2016/ 2:30PM Date and time of second attempt:03/02/2016/ 1:30PM  Lynden OxfordKadijah R Maigan Rose, MSW, LCSW-A 03/02/2016, 1:36 PM

## 2016-03-02 NOTE — Plan of Care (Signed)
[facility] The[facility] Inter[facility] Goal: STG-Patient will demonstrate improved self esteem by identif STG: Self-Esteem - Within 4 treatment sessions, patient will verbalize at least 5 positive affirmation statements in each of 2 treatment sessions to increase self-esteem post d/c.  Outcome: Completed/Met Date Met: 03/02/16 Treatment Session 2; Completed 2 out of 2: At approximately 11:00 am, LRT met with patient in consultation room. Patient verbalized 5 positive affirmation statements. Patient reported, "I believe it" and "It feels better now". LRT encouraged patient to continue saying positive affirmation statements.  Leonette [facility], LRT/CTRS 11.20.17 11:20 am Goal: STG-Other Recreation Therapy Goal (Specify) STG: Stress Management - Within 4 treatment sessions, patient will verbalize understanding of the stress management techniques in each of 2 treatment sessions to increase stress management skills post d/c.  Outcome: Completed/Met Date Met: 03/02/16 Treatment Session 2; Completed 2 out of 2: At approximately 11:00 am, LRT met with patient in consultation room. Patient reported he read over and practiced the stress management techniques. Patient had a questions regarding one technique. LRT answered question. Patient verbalized understanding of the stress management techniques. LRT encouraged patient to continue practicing the stress management techniques.  Leonette Monarch, LRT/CTRS 11.20.17 11:22 am

## 2016-03-02 NOTE — Progress Notes (Signed)
Patient visible in the milieu. Alert and oriented, pleasant and cooperative. Denies SI/HI. Denies hallucinations. Attended SW group and remained active and cooperative. Ate well and has no concern so far. Emotional support provided. Safety precautions maintained.

## 2016-03-02 NOTE — Progress Notes (Signed)
D:  Per pt self inventory pt reports sleeping fair, appetite good, energy level normal, ability to pay attention good, rates depression at a 0 out of 10, hopelessness at a 0 out of 10, anxiety at a 1 out of 10, denies SI/HI/AVH, goal today: "Work my plan, read my plan every hour and check off", brightens during interaction.     A:  Emotional support provided, Encouraged pt to continue with treatment plan and attend all group activities, q15 min checks maintained for safety.  R:  Pt is receptive, going to groups, pleasant and cooperative with staff and other patients on the unit.
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
## 2016-03-02 NOTE — Plan of Care (Signed)
Problem: Coping: Goal: Ability to cope will improve Outcome: Progressing Emotional support provided. Patient remains pleasant with sense of humor

## 2016-03-02 NOTE — Progress Notes (Signed)
Patient had dinner in the dayroom. Remains calm and cooperative, with no concern. Refused his 5 pm medications, preferring to take them at bed time. Safety precautions maintained on the unit.

## 2016-03-02 NOTE — Plan of Care (Signed)
Problem: Coping: Goal: Ability to verbalize frustrations and anger appropriately will improve Outcome: Progressing Pt able to verbalize frustrations in a calm manner and seek out solutions calmly CTownsend RN

## 2016-03-02 NOTE — Plan of Care (Signed)
Problem: Medication: Goal: Compliance with prescribed medication regimen will improve Outcome: Progressing Taking medications as scheduled   

## 2016-03-03 NOTE — Progress Notes (Signed)
  [facility] :  Will you be returning to the same living situation after discharge:  Yes,  home with family. At discharge, do you have transportation home?: Yes,  [employer] [facility]. Do you have the ability to pay for your medications: No.  Release of information consent forms completed and in the chart;  Patient's signature needed at discharge.  Patient to Follow up at: Follow-up Information    [facility]. Go on 03/06/2016.   Why:  [facility]. Please bring your discharge paperwork to this appointment. It is important that you arrive 15 minutes early for prompt services.  Contact information: Address: [address] [place], [place] [address] Phone: [phone] Fax: [phone]       [facility]. Go on 03/04/2016.   Why:  Your appoinrment is at 9:30 am. Please arrive at 9:15 am with your discharge papers and current medication list. Contact information: [address] [facility] [place] [phone]       [facility] Go [facility].   Why:  Please arrive to [facility] intake assessment [facility] on November 22nd at 12:30PM. Please bring your discharge paperwork to this appointment. Arrive 15 minutes early for prompt services. Contact [facility] for questions or concerns. Contact information: Address: [address], [place], [place] [address] Phone: [phone] Fax: [phone]          Next level of care provider [facility] Link:no  Safety Planning and Suicide Prevention discussed: Yes,  SPE completed with patient.  Have you used any form of tobacco in the last 30 days? (Cigarettes, Smokeless Tobacco, Cigars, and/or Pipes): Yes  Has patient been referred to the Quitline?: Yes.  Patient has been referred for addiction treatment: Yes  Francisco OxfordKadijah R Hero Rose, MSW,  LCSW-A 03/03/2016, 10:24 AM
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
## 2016-03-03 NOTE — [facility] Group Notes (Signed)
[facility] Group Notes:  (Nursing/MHT/Case Management/Adjunct)  Date:  03/03/2016  Time:  4:04 AM  Type of Therapy:  Psychoeducational Skills  Participation Level:  Active  Participation Quality:  Appropriate and Sharing  Affect:  Appropriate  Cognitive:  Appropriate  Insight:  Appropriate and Good  Engagement in Group:  Engaged  Modes of Intervention:  Discussion, Socialization and Support  Summary of Progress/Problems:  Francisco MilroyLaquanda Y Alix Rose 03/03/2016, 4:04 AM

## 2016-03-03 NOTE — Plan of Care (Signed)
Problem: Education: Goal: Verbalization of understanding the information provided will improve Outcome: Progressing Patient verbalized feelings to staff.    

## 2016-03-03 NOTE — Progress Notes (Signed)
Recreation Therapy Notes  INPATIENT RECREATION TR PLAN  Patient Details Name: Francisco Rose MRN: [id number] DOB: [birth year] Today's Date: 03/03/2016  Rec Therapy Plan Is patient appropriate for Therapeutic Recreation?: Yes Treatment times per week: At least once a week TR Treatment/Interventions: 1:1 session, Group participation (Comment) (Appropriate participation in daily recreational therapy tx)  Discharge Criteria Pt will be discharged from therapy if:: Treatment goals are met, Discharged Treatment plan/goals/alternatives discussed and agreed upon by:: Patient/family  Discharge Summary Short term goals set: See Care Plan Short term goals met: Complete Progress toward goals comments: One-to-one attended Which groups?: Social skills, Wellness One-to-one attended: Self-esteem, stress management Reason goals not met: N/A Therapeutic equipment acquired: None Reason patient discharged from therapy: Discharge from hospital Pt/family agrees with progress & goals achieved: Yes Date patient discharged from therapy: 03/03/16   Leonette [facility], LRT/CTRS  03/03/2016, 1:52 PM

## 2016-03-03 NOTE — Progress Notes (Signed)
Patient denies SI/HI, denies A/V hallucinations. Patient verbalizes understanding of discharge instructions, follow up care and prescriptions. Patient given all belongings from  locker. Patient escorted out by staff, transported by cab to bus stop.
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
## 2016-03-04 ENCOUNTER — Encounter: Payer: Self-pay | Admitting: Physician Assistant

## 2016-03-04 ENCOUNTER — Ambulatory Visit: Payer: Self-pay | Attending: Internal Medicine | Admitting: Physician Assistant

## 2016-03-04 ENCOUNTER — Encounter: Payer: Self-pay | Admitting: Licensed Clinical Social Worker

## 2016-03-04 VITALS — BP 119/80 | HR 78 | Temp 98.1°F | Resp 16 | Wt 235.4 lb

## 2016-03-04 DIAGNOSIS — Z87828 Personal history of other (healed) physical injury and trauma: Secondary | ICD-10-CM | POA: Insufficient documentation

## 2016-03-04 DIAGNOSIS — T2009XA Burn of unspecified degree of multiple sites of head, face, and neck, initial encounter: Secondary | ICD-10-CM | POA: Insufficient documentation

## 2016-03-04 DIAGNOSIS — T2102XA Burn of unspecified degree of abdominal wall, initial encounter: Secondary | ICD-10-CM | POA: Insufficient documentation

## 2016-03-04 DIAGNOSIS — X76XXXA Intentional self-harm by smoke, fire and flames, initial encounter: Secondary | ICD-10-CM | POA: Insufficient documentation

## 2016-03-04 DIAGNOSIS — F329 Major depressive disorder, single episode, unspecified: Secondary | ICD-10-CM | POA: Insufficient documentation

## 2016-03-04 DIAGNOSIS — F1910 Other psychoactive substance abuse, uncomplicated: Secondary | ICD-10-CM | POA: Insufficient documentation

## 2016-03-04 DIAGNOSIS — F339 Major depressive disorder, recurrent, unspecified: Secondary | ICD-10-CM

## 2016-03-04 MED ORDER — FLUOXETINE HCL 20 MG PO CAPS: 20.0000 mg | ORAL_CAPSULE | Freq: Every day | ORAL | 2 refills | Status: AC

## 2016-03-04 MED ORDER — TRAZODONE HCL 100 MG PO TABS: 100.0000 mg | ORAL_TABLET | Freq: Every evening | ORAL | 2 refills | Status: AC | PRN

## 2016-03-04 MED FILL — SSD 1% CREAM: 1 | 25 days supply | Qty: 50 | Fill #0

## 2016-03-04 MED FILL — traZODone HCL 100 MG TABS: 100 | 30 days supply | Qty: 30 | Fill #0

## 2016-03-04 MED FILL — FLUoxetine HCL 20 MG CAPS: 20 | 30 days supply | Qty: 30 | Fill #0
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
## 2016-03-04 NOTE — BH Specialist Note (Signed)
Session Start time: 10:00 am   End Time: 10:30 am Total Time:  30 minutes Type of Service: Behavioral Health - Individual/Family Interpreter: No.   Interpreter Name & Language: N/A # [facility]-June [facility]   SUBJECTIVE: Francisco Rose is a 45 y.o. male  Pt. was referred by Zenda AlpersPA-C Noel for:  anxiety and depression. Pt. reports the following symptoms/concerns: excessive sleeping, overeating, withdrawn behavior, and overthinking Duration of problem:  ongoing Severity: severe Previous treatment: Pt recently discharged from [facility]. Has scheduled appointment with [facility] today at 2:30 pm and [facility]/24 at 9:00 am   OBJECTIVE: Mood: Anxious & Affect: Depressed Risk of harm to self or others: Pt denied SI/HI. Has hx of self-harming behavior (burning) Assessments administered: PHQ-9; GAD-7  LIFE CONTEXT:  Family & Social: Pt resides with mother and brother. Reported having a support system; however, "I just don't utilize it" Pt reported having four adult children who reside in [place] School/ Work: Pt is unemployed. 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  Self-Care: Pt reports overeating and excessive sleeping. He engages in polysubstance use (alcohol, marijuana, crack cocaine, and cigarettes) Life changes: Pt reported difficulty obtaining employment due to criminal charges ("petty larceny") Pt's daughter recently left residence due to she and pt's mother not getting along What is important to pt/family (values): Family, Financial Independece   GOALS ADDRESSED:  Decrease symptoms of depression Decrease symptoms of anxiety  INTERVENTIONS: Motivational Interviewing, Strength-based and Supportive   ASSESSMENT:  Pt currently experiencing depression and anxiety triggered by polysubstance use. Pt reports excessive sleeping, overeating, withdrawn behavior, and over-thinking. Pt may benefit from  pscyhoeducation, psychotherapy, and medication management. LCSWA educated pt on the cycle of depression and anxiety and discussed how substance use can negatively impact pt's mental and physical health. Pt and LCSWA discussed realistic healthy coping skills that can assist in a decrease symptoms. LCSWA verbalized the importance of medication compliance and following through scheduled appointments with [facility] and [facility]. LCSWA encouraged pt to schedule appointment with financial counseling to assist with financial stressors. Pt was provided transportation assistance and crisis resources.         PLAN: 1. F/U with behavioral health clinician: Pt was encouraged to contact LCSWA if symptoms worsen or fail to improve to [facility]. 2. Behavioral Health meds: Prozac and Desyrel 3. Behavioral recommendations: LCSWA recommends that pt apply healthy coping skills discussed. Pt is encouraged to attend scheduled appts with [facility] and [facility]. Pt informed that he could schedule follow up appointment with LCSWA, if needed 4. Referral: Brief Counseling/Psychotherapy, [facility], Problem-solving teaching/coping strategies, Psychoeducation and Supportive Counseling 5. From scale of 1-10, how likely are you to follow plan: 9/10   Bridgett LarssonJasmine D Lewis, MSW, LCSWA  Clinical Social Worker 03/04/16 11:26 am  Warmhandoff:   Warm Hand Off Completed.
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
## 2016-03-04 NOTE — Addendum Note (Signed)
Addended byScot Jun: Velina Drollinger S on: 03/04/2016 01:15 PM   Modules accepted: Orders

## 2016-03-09 ENCOUNTER — Ambulatory Visit: Payer: Self-pay | Admitting: Internal Medicine

## 2016-03-09 ENCOUNTER — Inpatient Hospital Stay (HOSPITAL_COMMUNITY)
Admission: EM | Admit: 2016-03-09 | Discharge: 2016-03-16 | DRG: 339 | Disposition: A | Discharge status: Home / Self Care | Payer: Self-pay | Attending: General Surgery | Admitting: General Surgery

## 2016-03-09 ENCOUNTER — Emergency Department (HOSPITAL_COMMUNITY): Payer: Self-pay

## 2016-03-09 ENCOUNTER — Encounter (HOSPITAL_COMMUNITY): Payer: Self-pay | Admitting: *Deleted

## 2016-03-09 DIAGNOSIS — E669 Obesity, unspecified: Secondary | ICD-10-CM | POA: Diagnosis present

## 2016-03-09 DIAGNOSIS — K660 Peritoneal adhesions (postprocedural) (postinfection): Secondary | ICD-10-CM | POA: Diagnosis present

## 2016-03-09 DIAGNOSIS — J449 Chronic obstructive pulmonary disease, unspecified: Secondary | ICD-10-CM | POA: Diagnosis present

## 2016-03-09 DIAGNOSIS — T2102XD Burn of unspecified degree of abdominal wall, subsequent encounter: Secondary | ICD-10-CM

## 2016-03-09 DIAGNOSIS — Z79899 Other long term (current) drug therapy: Secondary | ICD-10-CM

## 2016-03-09 DIAGNOSIS — K37 Unspecified appendicitis: Secondary | ICD-10-CM

## 2016-03-09 DIAGNOSIS — Z6834 Body mass index (BMI) 34.0-34.9, adult: Secondary | ICD-10-CM

## 2016-03-09 DIAGNOSIS — K352 Acute appendicitis with generalized peritonitis: Principal | ICD-10-CM | POA: Diagnosis present

## 2016-03-09 DIAGNOSIS — X76XXXD Intentional self-harm by smoke, fire and flames, subsequent encounter: Secondary | ICD-10-CM

## 2016-03-09 DIAGNOSIS — F332 Major depressive disorder, recurrent severe without psychotic features: Secondary | ICD-10-CM | POA: Diagnosis present

## 2016-03-09 DIAGNOSIS — E46 Unspecified protein-calorie malnutrition: Secondary | ICD-10-CM | POA: Diagnosis present

## 2016-03-09 DIAGNOSIS — F419 Anxiety disorder, unspecified: Secondary | ICD-10-CM | POA: Diagnosis present

## 2016-03-09 DIAGNOSIS — K567 Ileus, unspecified: Secondary | ICD-10-CM | POA: Diagnosis not present

## 2016-03-09 DIAGNOSIS — K353 Acute appendicitis with localized peritonitis, without perforation or gangrene: Secondary | ICD-10-CM

## 2016-03-09 DIAGNOSIS — F17210 Nicotine dependence, cigarettes, uncomplicated: Secondary | ICD-10-CM | POA: Diagnosis present

## 2016-03-09 DIAGNOSIS — E860 Dehydration: Secondary | ICD-10-CM | POA: Diagnosis present

## 2016-03-09 DIAGNOSIS — K9189 Other postprocedural complications and disorders of digestive system: Secondary | ICD-10-CM

## 2016-03-09 LAB — COMPREHENSIVE METABOLIC PANEL
ALBUMIN: 4.8 g/dL (ref 3.5–5.0)
ALT: 27 U/L (ref 17–63)
AST: 22 U/L (ref 15–41)
Alkaline Phosphatase: 73 U/L (ref 38–126)
Anion gap: 9 (ref 5–15)
BILIRUBIN TOTAL: 1 mg/dL (ref 0.3–1.2)
BUN: 16 mg/dL (ref 6–20)
CHLORIDE: 98 mmol/L — AB (ref 101–111)
CO2: 29 mmol/L (ref 22–32)
Calcium: 9.2 mg/dL (ref 8.9–10.3)
Creatinine, Ser: 1.42 mg/dL — ABNORMAL HIGH (ref 0.61–1.24)
GFR calc Af Amer: >60 mL/min (ref >60)
GFR calc non Af Amer: 58 mL/min — ABNORMAL LOW (ref >60)
GLUCOSE: 105 mg/dL — AB (ref 65–99)
POTASSIUM: 3.4 mmol/L — AB (ref 3.5–5.1)
Sodium: 136 mmol/L (ref 135–145)
TOTAL PROTEIN: 8.4 g/dL — AB (ref 6.5–8.1)

## 2016-03-09 LAB — URINE MICROSCOPIC-ADD ON
RBC / HPF: NONE SEEN RBC/hpf (ref 0–5)
Squamous Epithelial / LPF: NONE SEEN

## 2016-03-09 LAB — CBC
HEMATOCRIT: 46.4 % (ref 39.0–52.0)
HEMOGLOBIN: 15.6 g/dL (ref 13.0–17.0)
MCH: 29.9 pg (ref 26.0–34.0)
MCHC: 33.6 g/dL (ref 30.0–36.0)
MCV: 89.1 fL (ref 78.0–100.0)
PLATELETS: 338 10*3/uL (ref 150–400)
RBC: 5.21 MIL/uL (ref 4.22–5.81)
RDW: 13.5 % (ref 11.5–15.5)
WBC: 12.7 10*3/uL — AB (ref 4.0–10.5)

## 2016-03-09 LAB — URINALYSIS, ROUTINE W REFLEX MICROSCOPIC
GLUCOSE, UA: NEGATIVE mg/dL
HGB URINE DIPSTICK: NEGATIVE
Ketones, ur: NEGATIVE mg/dL
Nitrite: POSITIVE — AB
PROTEIN: 30 mg/dL — AB
SPECIFIC GRAVITY, URINE: 1.037 — AB (ref 1.005–1.030)
pH: 6 (ref 5.0–8.0)

## 2016-03-09 LAB — I-STAT CG4 LACTIC ACID, ED: Lactic Acid, Venous: 0.81 mmol/L (ref 0.5–1.9)

## 2016-03-09 LAB — LIPASE, BLOOD: Lipase: 22 U/L (ref 11–51)

## 2016-03-09 MED ORDER — SODIUM CHLORIDE 0.9 % IJ SOLN
INTRAMUSCULAR | Status: AC
  Filled 2016-03-09: qty 50

## 2016-03-09 MED ORDER — SODIUM CHLORIDE 0.9 % IV BOLUS (SEPSIS)
1000.0000 mL | Freq: Once | INTRAVENOUS | Status: AC
  Administered 2016-03-09: 1000 mL via INTRAVENOUS

## 2016-03-09 MED ORDER — PANTOPRAZOLE SODIUM 40 MG IV SOLR
40.0000 mg | Freq: Every day | INTRAVENOUS | Status: DC
  Administered 2016-03-10 – 2016-03-15 (×7): 40 mg via INTRAVENOUS
  Filled 2016-03-09 (×7): qty 40

## 2016-03-09 MED ORDER — SILVER SULFADIAZINE 1 % EX CREA
TOPICAL_CREAM | Freq: Two times a day (BID) | CUTANEOUS | Status: DC
  Administered 2016-03-10: 1 via TOPICAL
  Administered 2016-03-10 – 2016-03-16 (×10): via TOPICAL
  Filled 2016-03-09: qty 50

## 2016-03-09 MED ORDER — PIPERACILLIN-TAZOBACTAM 3.375 G IVPB 30 MIN
3.3750 g | Freq: Once | INTRAVENOUS | Status: AC
  Administered 2016-03-09: 3.375 g via INTRAVENOUS
  Filled 2016-03-09: qty 50

## 2016-03-09 MED ORDER — IOPAMIDOL (ISOVUE-300) INJECTION 61%
INTRAVENOUS | Status: AC
  Filled 2016-03-09: qty 100

## 2016-03-09 MED ORDER — ONDANSETRON HCL 4 MG/2ML IJ SOLN
4.0000 mg | Freq: Four times a day (QID) | INTRAMUSCULAR | Status: DC | PRN
  Administered 2016-03-11 – 2016-03-16 (×13): 4 mg via INTRAVENOUS
  Filled 2016-03-09 (×13): qty 2

## 2016-03-09 MED ORDER — HEPARIN SODIUM (PORCINE) 5000 UNIT/ML IJ SOLN
5000.0000 [IU] | Freq: Three times a day (TID) | INTRAMUSCULAR | Status: DC
  Administered 2016-03-10 – 2016-03-16 (×17): 5000 [IU] via SUBCUTANEOUS
  Filled 2016-03-09 (×17): qty 1

## 2016-03-09 MED ORDER — KCL IN DEXTROSE-NACL 20-5-0.45 MEQ/L-%-% IV SOLN
INTRAVENOUS | Status: DC
  Administered 2016-03-10: 01:00:00 via INTRAVENOUS
  Administered 2016-03-10: 1000 mL via INTRAVENOUS
  Administered 2016-03-11: 01:00:00 via INTRAVENOUS
  Filled 2016-03-09 (×5): qty 1000

## 2016-03-09 MED ORDER — IOPAMIDOL (ISOVUE-300) INJECTION 61%
100.0000 mL | Freq: Once | INTRAVENOUS | Status: AC | PRN
Start: 2016-03-09 — End: 2016-03-09
  Administered 2016-03-09: 100 mL via INTRAVENOUS

## 2016-03-09 MED ORDER — PIPERACILLIN-TAZOBACTAM 3.375 G IVPB
3.3750 g | Freq: Three times a day (TID) | INTRAVENOUS | Status: DC
  Administered 2016-03-10 – 2016-03-16 (×18): 3.375 g via INTRAVENOUS
  Filled 2016-03-09 (×22): qty 50

## 2016-03-09 MED ORDER — ONDANSETRON 4 MG PO TBDP: 4.0000 mg | ORAL_TABLET | Freq: Four times a day (QID) | ORAL | Status: DC | PRN

## 2016-03-09 MED ORDER — MORPHINE SULFATE (PF) 2 MG/ML IV SOLN
1.0000 mg | INTRAVENOUS | Status: DC | PRN
  Administered 2016-03-10 – 2016-03-11 (×7): 1 mg via INTRAVENOUS
  Filled 2016-03-09 (×7): qty 1
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
## 2016-03-09 NOTE — ED Notes (Signed)
ED Provider at bedside. 

## 2016-03-09 NOTE — ED Notes (Signed)
Bed: WA06 Expected date:  Expected time:  Means of arrival:  Comments: triage 1

## 2016-03-10 ENCOUNTER — Encounter (HOSPITAL_COMMUNITY): Admission: EM | Disposition: A | Discharge status: Home / Self Care | Payer: Self-pay | Source: Home / Self Care

## 2016-03-10 ENCOUNTER — Encounter (HOSPITAL_COMMUNITY): Payer: Self-pay | Admitting: *Deleted

## 2016-03-10 ENCOUNTER — Observation Stay (HOSPITAL_COMMUNITY): Payer: Self-pay | Admitting: Anesthesiology

## 2016-03-10 HISTORY — PX: LAPAROSCOPIC APPENDECTOMY: SHX408

## 2016-03-10 LAB — CBC WITH DIFFERENTIAL/PLATELET
Basophils Absolute: 0 10*3/uL (ref 0.0–0.1)
Basophils Relative: 0 %
EOS PCT: 0 %
Eosinophils Absolute: 0 10*3/uL (ref 0.0–0.7)
HCT: 41.8 % (ref 39.0–52.0)
Hemoglobin: 14.1 g/dL (ref 13.0–17.0)
LYMPHS ABS: 1.5 10*3/uL (ref 0.7–4.0)
LYMPHS PCT: 10 %
MCH: 30 pg (ref 26.0–34.0)
MCHC: 33.7 g/dL (ref 30.0–36.0)
MCV: 88.9 fL (ref 78.0–100.0)
MONOS PCT: 12 %
Monocytes Absolute: 1.9 10*3/uL — ABNORMAL HIGH (ref 0.1–1.0)
Neutro Abs: 12.3 10*3/uL — ABNORMAL HIGH (ref 1.7–7.7)
Neutrophils Relative %: 78 %
PLATELETS: 299 10*3/uL (ref 150–400)
RBC: 4.7 MIL/uL (ref 4.22–5.81)
RDW: 13.8 % (ref 11.5–15.5)
WBC: 15.8 10*3/uL — AB (ref 4.0–10.5)

## 2016-03-10 LAB — COMPREHENSIVE METABOLIC PANEL
ALK PHOS: 57 U/L (ref 38–126)
ALT: 21 U/L (ref 17–63)
ANION GAP: 7 (ref 5–15)
AST: 18 U/L (ref 15–41)
Albumin: 3.7 g/dL (ref 3.5–5.0)
BUN: 14 mg/dL (ref 6–20)
CALCIUM: 8.8 mg/dL — AB (ref 8.9–10.3)
CHLORIDE: 104 mmol/L (ref 101–111)
CO2: 26 mmol/L (ref 22–32)
CREATININE: 1.11 mg/dL (ref 0.61–1.24)
Glucose, Bld: 106 mg/dL — ABNORMAL HIGH (ref 65–99)
Potassium: 3.7 mmol/L (ref 3.5–5.1)
Sodium: 137 mmol/L (ref 135–145)
Total Bilirubin: 0.8 mg/dL (ref 0.3–1.2)
Total Protein: 6.9 g/dL (ref 6.5–8.1)

## 2016-03-10 LAB — HIV ANTIBODY (ROUTINE TESTING W REFLEX): HIV Screen 4th Generation wRfx: NONREACTIVE

## 2016-03-10 LAB — MRSA PCR SCREENING: MRSA BY PCR: NEGATIVE

## 2016-03-10 SURGERY — APPENDECTOMY, LAPAROSCOPIC
Anesthesia: General | Site: Abdomen

## 2016-03-10 MED ORDER — 0.9 % SODIUM CHLORIDE (POUR BTL) OPTIME
TOPICAL | Status: DC | PRN
  Administered 2016-03-10: 1000 mL

## 2016-03-10 MED ORDER — ROCURONIUM BROMIDE 10 MG/ML (PF) SYRINGE
PREFILLED_SYRINGE | INTRAVENOUS | Status: DC | PRN
  Administered 2016-03-10: 50 mg via INTRAVENOUS

## 2016-03-10 MED ORDER — PHENOL 1.4 % MT LIQD
1.0000 | OROMUCOSAL | Status: DC | PRN
  Administered 2016-03-10: 1 via OROMUCOSAL
  Filled 2016-03-10: qty 177

## 2016-03-10 MED ORDER — SUCCINYLCHOLINE CHLORIDE 200 MG/10ML IV SOSY
PREFILLED_SYRINGE | INTRAVENOUS | Status: DC | PRN
  Administered 2016-03-10: 140 mg via INTRAVENOUS

## 2016-03-10 MED ORDER — VITAMIN B-1 100 MG PO TABS
100.0000 mg | ORAL_TABLET | Freq: Every day | ORAL | Status: DC
  Administered 2016-03-11 – 2016-03-16 (×5): 100 mg via ORAL
  Filled 2016-03-10 (×5): qty 1

## 2016-03-10 MED ORDER — FENTANYL CITRATE (PF) 100 MCG/2ML IJ SOLN
INTRAMUSCULAR | Status: DC | PRN
  Administered 2016-03-10 (×3): 100 ug via INTRAVENOUS

## 2016-03-10 MED ORDER — FOLIC ACID 1 MG PO TABS
1.0000 mg | ORAL_TABLET | Freq: Every day | ORAL | Status: DC
  Administered 2016-03-11 – 2016-03-16 (×5): 1 mg via ORAL
  Filled 2016-03-10 (×5): qty 1

## 2016-03-10 MED ORDER — DEXAMETHASONE SODIUM PHOSPHATE 10 MG/ML IJ SOLN
INTRAMUSCULAR | Status: DC | PRN
Start: 2016-03-10 — End: 2016-03-10
  Administered 2016-03-10: 10 mg via INTRAVENOUS

## 2016-03-10 MED ORDER — MIDAZOLAM HCL 2 MG/2ML IJ SOLN: 0.5000 mg | Freq: Once | INTRAMUSCULAR | Status: DC | PRN

## 2016-03-10 MED ORDER — HYDROMORPHONE HCL 1 MG/ML IJ SOLN
0.2500 mg | INTRAMUSCULAR | Status: DC | PRN
  Administered 2016-03-10 (×2): 0.5 mg via INTRAVENOUS

## 2016-03-10 MED ORDER — BUPIVACAINE-EPINEPHRINE 0.25% -1:200000 IJ SOLN
INTRAMUSCULAR | Status: DC | PRN
  Administered 2016-03-10: 11 mL

## 2016-03-10 MED ORDER — ONDANSETRON HCL 4 MG/2ML IJ SOLN
INTRAMUSCULAR | Status: DC | PRN
  Administered 2016-03-10: 4 mg via INTRAVENOUS

## 2016-03-10 MED ORDER — PROMETHAZINE HCL 25 MG/ML IJ SOLN: 6.2500 mg | INTRAMUSCULAR | Status: DC | PRN

## 2016-03-10 MED ORDER — KCL IN DEXTROSE-NACL 20-5-0.45 MEQ/L-%-% IV SOLN
INTRAVENOUS | Status: AC
  Administered 2016-03-10: 1000 mL via INTRAVENOUS
  Filled 2016-03-10: qty 1000

## 2016-03-10 MED ORDER — SUCCINYLCHOLINE CHLORIDE 200 MG/10ML IV SOSY: PREFILLED_SYRINGE | INTRAVENOUS | Status: DC | PRN

## 2016-03-10 MED ORDER — PROPOFOL 10 MG/ML IV BOLUS
INTRAVENOUS | Status: DC | PRN
  Administered 2016-03-10: 200 mg via INTRAVENOUS

## 2016-03-10 MED ORDER — LACTATED RINGERS IV SOLN
INTRAVENOUS | Status: DC
  Administered 2016-03-10 (×2): via INTRAVENOUS

## 2016-03-10 MED ORDER — SUGAMMADEX SODIUM 500 MG/5ML IV SOLN
INTRAVENOUS | Status: DC | PRN
  Administered 2016-03-10: 300 mg via INTRAVENOUS

## 2016-03-10 MED ORDER — ADULT MULTIVITAMIN W/MINERALS CH
1.0000 | ORAL_TABLET | Freq: Every day | ORAL | Status: DC
  Administered 2016-03-11 – 2016-03-16 (×5): 1 via ORAL
  Filled 2016-03-10 (×5): qty 1

## 2016-03-10 MED ORDER — LACTATED RINGERS IR SOLN
Status: DC | PRN
  Administered 2016-03-10: 1000 mL

## 2016-03-10 MED ORDER — LORAZEPAM 1 MG PO TABS: 1.0000 mg | ORAL_TABLET | Freq: Four times a day (QID) | ORAL | Status: AC | PRN

## 2016-03-10 MED ORDER — THIAMINE HCL 100 MG/ML IJ SOLN
100.0000 mg | Freq: Every day | INTRAMUSCULAR | Status: DC
  Filled 2016-03-10 (×7): qty 1

## 2016-03-10 MED ORDER — MIDAZOLAM HCL 5 MG/5ML IJ SOLN
INTRAMUSCULAR | Status: DC | PRN
  Administered 2016-03-10: 2 mg via INTRAVENOUS

## 2016-03-10 MED ORDER — HYDROMORPHONE HCL 1 MG/ML IJ SOLN
INTRAMUSCULAR | Status: AC
  Administered 2016-03-10: 0.5 mg via INTRAVENOUS
  Filled 2016-03-10: qty 1

## 2016-03-10 MED ORDER — LIDOCAINE 2% (20 MG/ML) 5 ML SYRINGE
INTRAMUSCULAR | Status: DC | PRN
  Administered 2016-03-10: 100 mg via INTRAVENOUS

## 2016-03-10 MED ORDER — MEPERIDINE HCL 50 MG/ML IJ SOLN: 6.2500 mg | INTRAMUSCULAR | Status: DC | PRN

## 2016-03-10 MED ORDER — LORAZEPAM 2 MG/ML IJ SOLN
1.0000 mg | Freq: Four times a day (QID) | INTRAMUSCULAR | Status: AC | PRN
  Administered 2016-03-12: 1 mg via INTRAVENOUS
  Filled 2016-03-10 (×2): qty 1

## 2016-03-10 SURGICAL SUPPLY — 36 items
APPLIER CLIP 5 13 M/L LIGAMAX5 (MISCELLANEOUS)
APPLIER CLIP ROT 10 11.4 M/L (STAPLE)
CABLE HIGH FREQUENCY MONO STRZ (ELECTRODE) ×3 IMPLANT
CHLORAPREP W/TINT 26ML (MISCELLANEOUS) ×3 IMPLANT
CLIP APPLIE 5 13 M/L LIGAMAX5 (MISCELLANEOUS) IMPLANT
CLIP APPLIE ROT 10 11.4 M/L (STAPLE) IMPLANT
COVER SURGICAL LIGHT HANDLE (MISCELLANEOUS) ×3 IMPLANT
CUTTER FLEX LINEAR 45M (STAPLE) ×3 IMPLANT
DECANTER SPIKE VIAL GLASS SM (MISCELLANEOUS) ×3 IMPLANT
DERMABOND ADVANCED (GAUZE/BANDAGES/DRESSINGS) ×2
DERMABOND ADVANCED .7 DNX12 (GAUZE/BANDAGES/DRESSINGS) ×1 IMPLANT
DRAIN CHANNEL RND F F (WOUND CARE) ×3 IMPLANT
DRAPE LAPAROSCOPIC ABDOMINAL (DRAPES) ×3 IMPLANT
ELECT REM PT RETURN 9FT ADLT (ELECTROSURGICAL) ×3
ELECTRODE REM PT RTRN 9FT ADLT (ELECTROSURGICAL) ×1 IMPLANT
EVACUATOR SILICONE 100CC (DRAIN) ×3 IMPLANT
GLOVE BIOGEL PI IND STRL 7.5 (GLOVE) ×1 IMPLANT
GLOVE BIOGEL PI INDICATOR 7.5 (GLOVE) ×2
GLOVE ECLIPSE 7.5 STRL STRAW (GLOVE) ×3 IMPLANT
GOWN STRL REUS W/TWL XL LVL3 (GOWN DISPOSABLE) ×6 IMPLANT
IRRIG SUCT STRYKERFLOW 2 WTIP (MISCELLANEOUS) ×3
IRRIGATION SUCT STRKRFLW 2 WTP (MISCELLANEOUS) ×1 IMPLANT
KIT BASIN OR (CUSTOM PROCEDURE TRAY) ×3 IMPLANT
POUCH SPECIMEN RETRIEVAL 10MM (ENDOMECHANICALS) ×9 IMPLANT
RELOAD 45 VASCULAR/THIN (ENDOMECHANICALS) IMPLANT
RELOAD STAPLE TA45 3.5 REG BLU (ENDOMECHANICALS) ×3 IMPLANT
SCISSORS LAP 5X35 DISP (ENDOMECHANICALS) ×3 IMPLANT
SHEARS HARMONIC ACE PLUS 36CM (ENDOMECHANICALS) ×3 IMPLANT
SLEEVE XCEL OPT CAN 5 100 (ENDOMECHANICALS) ×3 IMPLANT
SUT MNCRL AB 4-0 PS2 18 (SUTURE) ×3 IMPLANT
TOWEL OR 17X26 10 PK STRL BLUE (TOWEL DISPOSABLE) ×3 IMPLANT
TRAY FOLEY W/METER SILVER 16FR (SET/KITS/TRAYS/PACK) ×3 IMPLANT
TRAY LAPAROSCOPIC (CUSTOM PROCEDURE TRAY) ×3 IMPLANT
TROCAR BLADELESS OPT 5 100 (ENDOMECHANICALS) ×3 IMPLANT
TROCAR XCEL BLUNT TIP 100MML (ENDOMECHANICALS) ×3 IMPLANT
TUBING INSUF HEATED (TUBING) ×3 IMPLANT

## 2016-03-10 NOTE — Anesthesia Preprocedure Evaluation (Addendum)
Anesthesia Evaluation  Patient identified by MRN, date of birth, ID band Patient awake    Reviewed: Allergy & Precautions, NPO status , Patient's Chart, lab work & pertinent test results  Airway Mallampati: II  TM Distance: >3 FB Neck ROM: Full    Dental  (+) Dental Advisory Given   Pulmonary COPD, Current Smoker,    breath sounds clear to auscultation       Cardiovascular negative cardio ROS   Rhythm:Regular Rate:Normal     Neuro/Psych Depression negative neurological ROS     GI/Hepatic (+)     substance abuse (last use 11/25)  alcohol use, cocaine use and marijuana use, Acute appy   Endo/Other  negative endocrine ROS  Renal/GU negative Renal ROS     Musculoskeletal   Abdominal (+) + obese,   Peds  Hematology negative hematology ROS (+)   Anesthesia Other Findings   Reproductive/Obstetrics                            Anesthesia Physical Anesthesia Plan  ASA: II  Anesthesia Plan: General   Post-op Pain Management:    Induction: Intravenous  Airway Management Planned: Oral ETT  Additional Equipment:   Intra-op Plan:   Post-operative Plan: Extubation in OR  Informed Consent: I have reviewed the patients History and Physical, chart, labs and discussed the procedure including the risks, benefits and alternatives for the proposed anesthesia with the patient or authorized representative who has indicated his/her understanding and acceptance.   Dental advisory given  Plan Discussed with: CRNA and Surgeon  Anesthesia Plan Comments: (Plan routine monitors, GETA)        Anesthesia Quick Evaluation

## 2016-03-10 NOTE — Transfer of Care (Signed)
Immediate Anesthesia Transfer of Care Note  Patient: Francisco BrunswickMickey Penafiel  Procedure(s) Performed: Procedure(s): APPENDECTOMY LAPAROSCOPIC (N/A)  Patient Location: PACU  Anesthesia Type:General  Level of Consciousness: sedated  Airway & Oxygen Therapy: Patient Spontanous Breathing and Patient connected to face mask oxygen  Post-op Assessment: Report given to RN and Post -op Vital signs reviewed and stable  Post vital signs: Reviewed and stable  Last Vitals:  Vitals:   03/10/16 0010 03/10/16 0620  BP: (!) 140/96 138/80  Pulse: 96 94  Resp: 20 20  Temp: 37.6 C (!) 38.7 C    Last Pain:  Vitals:   03/10/16 1001  TempSrc:   PainSc: 7       Patients Stated Pain Goal: 0 (03/10/16 1001)  Complications: No apparent anesthesia complications

## 2016-03-10 NOTE — Anesthesia Procedure Notes (Signed)
Procedure Name: Intubation Date/Time: 03/10/2016 11:31 AM Performed by: Doran ClayALDAY, Haasini Patnaude R Pre-anesthesia Checklist: Patient identified, Timeout performed, Emergency Drugs available, Suction available and Patient being monitored Patient Re-evaluated:Patient Re-evaluated prior to inductionOxygen Delivery Method: Circle system utilized Preoxygenation: Pre-oxygenation with 100% oxygen Intubation Type: IV induction, Rapid sequence and Cricoid Pressure applied Laryngoscope Size: Mac and 4 Grade View: Grade I Tube type: Oral Tube size: 7.5 mm Number of attempts: 2 (Able to see cords, first intubation bounced off cords, removed and replaced through cords.) Airway Equipment and Method: Stylet Placement Confirmation: ETT inserted through vocal cords under direct vision,  positive ETCO2 and breath sounds checked- equal and bilateral Secured at: 22 cm Tube secured with: Tape Dental Injury: Teeth and Oropharynx as per pre-operative assessment
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
## 2016-03-10 NOTE — Anesthesia Postprocedure Evaluation (Signed)
Anesthesia Post Note  Patient: Francisco BrunswickMickey Rose  Procedure(s) Performed: Procedure(s) (LRB): APPENDECTOMY LAPAROSCOPIC (N/A)  Patient location during evaluation: PACU Anesthesia Type: General Level of consciousness: sedated, patient cooperative and oriented Pain management: pain level controlled Vital Signs Assessment: post-procedure vital signs reviewed and stable Respiratory status: spontaneous breathing, nonlabored ventilation and respiratory function stable Cardiovascular status: blood pressure returned to baseline and stable Postop Assessment: no signs of nausea or vomiting Anesthetic complications: no    Last Vitals:  Vitals:   03/10/16 1415 03/10/16 1427  BP: 133/81 (!) 143/85  Pulse: 80 78  Resp: 13 16  Temp: 37.3 C 37.3 C    Last Pain:  Vitals:   03/10/16 1415  TempSrc:   PainSc: Asleep                 Allen Basista,E. Arsalan Brisbin

## 2016-03-10 NOTE — Op Note (Signed)
Preoperative Diagnosis: Abdominal Pain appendicitis  Postoprative Diagnosis: Abdominal Pain appendicitis  Procedure: Procedure(s): APPENDECTOMY LAPAROSCOPIC   Surgeon: Glenna FellowsHoxworth, Eileen Kangas T   Assistants: None  Anesthesia:  General endotracheal - Double lumen tube  Indications: Patient is a 45 year old male who presents with at least 2 days of lower abdominal pain. He has lower abdominal peritoneal findings, elevated white count and CT scan showing an inflammatory process in the right lower quadrant most consistent with appendicitis likely perforated. After discussion of options with the patient and nature of surgery and risks detailed elsewhere we have elected to proceed with laparoscopic and possible open emergency appendectomy.    Procedure Detail:  He had received broad spectrum IV antibiotics preoperatively. He was taken to the operating room, placed in supine position on the operating table, and general endotracheal anesthesia induced. Foley catheter was placed. The abdomen was widely sterilely prepped and draped. Patient timeout was performed and correct procedure verified. Access was obtained with a  1 1/2 cm midline incision just above the umbilicus due to her obesity, fascia opened under direct vision and a 12 mm Hassan trocar placed through a mattress suture of 0 Vicryl without difficulty. Pneumoperitoneum was established. Under direct vision 5 mm trochars were placed in the upper midline and left lower quadrant. There were inflammatory adhesions of the cecum and terminal ileum to the right lateral pelvis. These were taken down with careful blunt dissection and the tip of a severely inflamed appendix was identified lateral and posterior to the cecum. The terminal ileum and cecum were fairly extensively mobilized with careful dissection with the Harmonic scalpel laterally and blunt dissection mobilizing them medially and gradually exposing the appendix which was severely inflamed and  partially gangrenous. The distal appendix was mobilized from the lateral inflammatory attachments with careful blunt dissection. An abscess cavity from a perforation in the mid appendix was opened and drained. With mostly blunt and some Harmonic dissection the appendix was tediously mobilized away from inflammatory attachments from the lateral pelvis and the cecum and terminal ileum further mobilized medially to allow exposure of the base of the appendix. The appendix was friable and gangrenous and 2 portions of it were removed separately with Endo Catch bag as the dissection progressed. I was unable to expose the base which was relatively less inflamed. The mesoappendix was divided sequentially with the Harmonic scalpel until the remainder of the appendix was completely freed out of the tip of the cecum. The appendix was divided at the tip of the cecum with a single firing of the Endo GIA 45 mm blue load stapler. The staple line was intact and without bleeding. The remainder of the specimen was removed with an Endo Catch bag. The right lower quadrant and pelvis were thoroughly irrigated and hemostasis obtained. A Surgicel pack was placed in the retroperitoneum where there was slight oozing with good hemostasis. A 19 Blake drain was left in the site of the previous abscess cavity and brought out through the left lower quadrant trocar site. At this point there was no evidence of bleeding or injury or other problems. All CO2 was evacuated and trochars removed. The mattress suture was secured at the supraumbilical incision. Skin incisions were closed with subcutaneous Vicryl and Monocryl and Dermabond. Sponge needle and instrument counts were correct.    Findings: Acute appendicitis with gangrene and perforation and abscess  Estimated Blood Loss:  Minimal         Drains: 19 Blake drain right lower quadrant  Blood Given: none  Specimens: Appendix        Complications:  * No complications entered  in OR log *         Disposition: PACU - hemodynamically stable.         Condition: stable

## 2016-03-11 LAB — BASIC METABOLIC PANEL
ANION GAP: 10 (ref 5–15)
BUN: 9 mg/dL (ref 6–20)
CO2: 22 mmol/L (ref 22–32)
Calcium: 8.9 mg/dL (ref 8.9–10.3)
Chloride: 102 mmol/L (ref 101–111)
Creatinine, Ser: 1.04 mg/dL (ref 0.61–1.24)
GFR calc Af Amer: >60 mL/min (ref >60)
Glucose, Bld: 165 mg/dL — ABNORMAL HIGH (ref 65–99)
POTASSIUM: 4.3 mmol/L (ref 3.5–5.1)
SODIUM: 134 mmol/L — AB (ref 135–145)

## 2016-03-11 LAB — COMPREHENSIVE METABOLIC PANEL
ALT: 17 U/L (ref 17–63)
AST: 21 U/L (ref 15–41)
Albumin: 3.7 g/dL (ref 3.5–5.0)
Alkaline Phosphatase: 57 U/L (ref 38–126)
Anion gap: 12 (ref 5–15)
BUN: 9 mg/dL (ref 6–20)
CHLORIDE: 102 mmol/L (ref 101–111)
CO2: 21 mmol/L — AB (ref 22–32)
CREATININE: 1.04 mg/dL (ref 0.61–1.24)
Calcium: 9 mg/dL (ref 8.9–10.3)
GFR calc non Af Amer: >60 mL/min (ref >60)
Glucose, Bld: 155 mg/dL — ABNORMAL HIGH (ref 65–99)
POTASSIUM: 4.3 mmol/L (ref 3.5–5.1)
SODIUM: 135 mmol/L (ref 135–145)
Total Bilirubin: 0.6 mg/dL (ref 0.3–1.2)
Total Protein: 7.5 g/dL (ref 6.5–8.1)

## 2016-03-11 LAB — CBC
HCT: 45 % (ref 39.0–52.0)
Hemoglobin: 15.2 g/dL (ref 13.0–17.0)
MCH: 30.2 pg (ref 26.0–34.0)
MCHC: 33.8 g/dL (ref 30.0–36.0)
MCV: 89.5 fL (ref 78.0–100.0)
PLATELETS: 298 10*3/uL (ref 150–400)
RBC: 5.03 MIL/uL (ref 4.22–5.81)
RDW: 13.8 % (ref 11.5–15.5)
WBC: 23.1 10*3/uL — AB (ref 4.0–10.5)

## 2016-03-11 LAB — URINE CULTURE: CULTURE: NO GROWTH

## 2016-03-11 MED ORDER — PROMETHAZINE HCL 25 MG/ML IJ SOLN
6.2500 mg | Freq: Four times a day (QID) | INTRAMUSCULAR | Status: DC | PRN
  Administered 2016-03-12 – 2016-03-16 (×6): 12.5 mg via INTRAVENOUS
  Filled 2016-03-11 (×6): qty 1

## 2016-03-11 MED ORDER — PROMETHAZINE HCL 25 MG/ML IJ SOLN
6.2500 mg | Freq: Once | INTRAMUSCULAR | Status: AC
  Administered 2016-03-11: 6.25 mg via INTRAVENOUS
  Filled 2016-03-11: qty 1

## 2016-03-11 MED ORDER — BACITRACIN ZINC 500 UNIT/GM EX OINT
TOPICAL_OINTMENT | Freq: Two times a day (BID) | CUTANEOUS | Status: DC
  Administered 2016-03-11: 15.7778 via TOPICAL
  Administered 2016-03-11: 22:00:00 via TOPICAL
  Administered 2016-03-12 – 2016-03-16 (×6): 15.7778 via TOPICAL
  Filled 2016-03-11: qty 28.35

## 2016-03-11 MED ORDER — NICOTINE 7 MG/24HR TD PT24
7.0000 mg | MEDICATED_PATCH | Freq: Every day | TRANSDERMAL | Status: DC
  Filled 2016-03-11 (×3): qty 1

## 2016-03-11 MED ORDER — MORPHINE SULFATE (PF) 2 MG/ML IV SOLN
1.0000 mg | INTRAVENOUS | Status: DC | PRN
  Administered 2016-03-11 – 2016-03-16 (×14): 2 mg via INTRAVENOUS
  Filled 2016-03-11 (×15): qty 1

## 2016-03-11 MED ORDER — KCL IN DEXTROSE-NACL 20-5-0.9 MEQ/L-%-% IV SOLN
INTRAVENOUS | Status: DC
  Administered 2016-03-11 – 2016-03-15 (×7): via INTRAVENOUS
  Filled 2016-03-11 (×10): qty 1000

## 2016-03-11 MED ORDER — ACETAMINOPHEN 10 MG/ML IV SOLN
1000.0000 mg | Freq: Four times a day (QID) | INTRAVENOUS | Status: AC
  Administered 2016-03-11 – 2016-03-12 (×4): 1000 mg via INTRAVENOUS
  Filled 2016-03-11 (×4): qty 100
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
## 2016-03-12 ENCOUNTER — Inpatient Hospital Stay (HOSPITAL_COMMUNITY): Payer: Self-pay

## 2016-03-12 LAB — CBC
HCT: 44.2 % (ref 39.0–52.0)
HEMOGLOBIN: 14.9 g/dL (ref 13.0–17.0)
MCH: 30.5 pg (ref 26.0–34.0)
MCHC: 33.7 g/dL (ref 30.0–36.0)
MCV: 90.6 fL (ref 78.0–100.0)
Platelets: 342 10*3/uL (ref 150–400)
RBC: 4.88 MIL/uL (ref 4.22–5.81)
RDW: 13.9 % (ref 11.5–15.5)
WBC: 12.8 10*3/uL — AB (ref 4.0–10.5)

## 2016-03-12 LAB — PREALBUMIN: PREALBUMIN: 12.4 mg/dL — AB (ref 18–38)

## 2016-03-12 MED ORDER — ACETAMINOPHEN 10 MG/ML IV SOLN
1000.0000 mg | Freq: Four times a day (QID) | INTRAVENOUS | Status: AC
  Administered 2016-03-12 – 2016-03-13 (×3): 1000 mg via INTRAVENOUS
  Filled 2016-03-12 (×4): qty 100
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
## 2016-03-13 LAB — CBC
HEMATOCRIT: 39.9 % (ref 39.0–52.0)
Hemoglobin: 13.6 g/dL (ref 13.0–17.0)
MCH: 30.1 pg (ref 26.0–34.0)
MCHC: 34.1 g/dL (ref 30.0–36.0)
MCV: 88.3 fL (ref 78.0–100.0)
Platelets: 281 10*3/uL (ref 150–400)
RBC: 4.52 MIL/uL (ref 4.22–5.81)
RDW: 13.7 % (ref 11.5–15.5)
WBC: 7.3 10*3/uL (ref 4.0–10.5)

## 2016-03-13 LAB — BASIC METABOLIC PANEL
Anion gap: 7 (ref 5–15)
BUN: 9 mg/dL (ref 6–20)
CHLORIDE: 103 mmol/L (ref 101–111)
CO2: 27 mmol/L (ref 22–32)
Calcium: 8.1 mg/dL — ABNORMAL LOW (ref 8.9–10.3)
Creatinine, Ser: 0.97 mg/dL (ref 0.61–1.24)
GFR calc non Af Amer: >60 mL/min (ref >60)
Glucose, Bld: 114 mg/dL — ABNORMAL HIGH (ref 65–99)
POTASSIUM: 3.9 mmol/L (ref 3.5–5.1)
SODIUM: 137 mmol/L (ref 135–145)

## 2016-03-13 MED ORDER — FLUOXETINE HCL 20 MG PO CAPS
20.0000 mg | ORAL_CAPSULE | Freq: Every day | ORAL | Status: DC
  Administered 2016-03-13 – 2016-03-16 (×4): 20 mg via ORAL
  Filled 2016-03-13 (×6): qty 1

## 2016-03-13 MED ORDER — KETOROLAC TROMETHAMINE 30 MG/ML IJ SOLN
30.0000 mg | Freq: Three times a day (TID) | INTRAMUSCULAR | Status: AC
  Administered 2016-03-13 – 2016-03-14 (×6): 30 mg via INTRAVENOUS
  Filled 2016-03-13 (×6): qty 1

## 2016-03-13 MED ORDER — TRAZODONE HCL 50 MG PO TABS: 100.0000 mg | ORAL_TABLET | Freq: Every evening | ORAL | Status: DC | PRN

## 2016-03-13 MED ORDER — OXYCODONE-ACETAMINOPHEN 5-325 MG PO TABS
1.0000 | ORAL_TABLET | ORAL | Status: DC | PRN
  Administered 2016-03-14 – 2016-03-16 (×4): 2 via ORAL
  Filled 2016-03-13 (×6): qty 2
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
## 2016-03-14 ENCOUNTER — Inpatient Hospital Stay (HOSPITAL_COMMUNITY): Payer: Self-pay
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
## 2016-03-15 MED ORDER — ALUM & MAG HYDROXIDE-SIMETH 200-200-20 MG/5ML PO SUSP
30.0000 mL | Freq: Four times a day (QID) | ORAL | Status: DC | PRN
  Administered 2016-03-15: 30 mL via ORAL
  Filled 2016-03-15: qty 30
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
## 2016-03-16 LAB — CBC WITH DIFFERENTIAL/PLATELET
BASOS ABS: 0 10*3/uL (ref 0.0–0.1)
Basophils Relative: 0 %
EOS ABS: 0.2 10*3/uL (ref 0.0–0.7)
Eosinophils Relative: 3 %
HEMATOCRIT: 41.1 % (ref 39.0–52.0)
Hemoglobin: 13.8 g/dL (ref 13.0–17.0)
LYMPHS ABS: 1.4 10*3/uL (ref 0.7–4.0)
LYMPHS PCT: 26 %
MCH: 29.8 pg (ref 26.0–34.0)
MCHC: 33.6 g/dL (ref 30.0–36.0)
MCV: 88.8 fL (ref 78.0–100.0)
MONOS PCT: 25 %
Monocytes Absolute: 1.4 10*3/uL — ABNORMAL HIGH (ref 0.1–1.0)
NEUTROS ABS: 2.4 10*3/uL (ref 1.7–7.7)
Neutrophils Relative %: 46 %
Platelets: 354 10*3/uL (ref 150–400)
RBC: 4.63 MIL/uL (ref 4.22–5.81)
RDW: 13.6 % (ref 11.5–15.5)
WBC: 5.4 10*3/uL (ref 4.0–10.5)

## 2016-03-16 LAB — BASIC METABOLIC PANEL
ANION GAP: 8 (ref 5–15)
BUN: 13 mg/dL (ref 6–20)
CALCIUM: 8.4 mg/dL — AB (ref 8.9–10.3)
CO2: 25 mmol/L (ref 22–32)
CREATININE: 1.11 mg/dL (ref 0.61–1.24)
Chloride: 104 mmol/L (ref 101–111)
Glucose, Bld: 127 mg/dL — ABNORMAL HIGH (ref 65–99)
Potassium: 3.9 mmol/L (ref 3.5–5.1)
SODIUM: 137 mmol/L (ref 135–145)

## 2016-03-16 MED ORDER — ONDANSETRON 4 MG PO TBDP: 4.0000 mg | ORAL_TABLET | Freq: Four times a day (QID) | ORAL | 0 refills | Status: AC | PRN

## 2016-03-16 MED ORDER — PANTOPRAZOLE SODIUM 40 MG PO TBEC: 40.0000 mg | DELAYED_RELEASE_TABLET | Freq: Every day | ORAL | Status: DC

## 2016-03-16 MED ORDER — OXYCODONE-ACETAMINOPHEN 5-325 MG PO TABS: 1.0000 | ORAL_TABLET | ORAL | 0 refills | Status: AC | PRN

## 2016-03-16 MED ORDER — POLYETHYLENE GLYCOL 3350 17 G PO PACK: 17.0000 g | PACK | Freq: Every day | ORAL | Status: DC | PRN

## 2016-03-16 MED ORDER — POLYETHYLENE GLYCOL 3350 17 G PO PACK: 17.0000 g | PACK | Freq: Every day | ORAL | 0 refills | Status: AC | PRN
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
## 2016-03-20 ENCOUNTER — Telehealth: Payer: Self-pay | Admitting: Licensed Clinical Social Worker

## 2016-03-20 NOTE — Telephone Encounter (Signed)
[facility] received an incoming call from pt.   Pt was unsure if he had a scheduled appointment next week. [facility] informed pt that he has an upcoming appointment with PCP on 03/31/16 at 10:30 am. Pt stated that he will be able to make scheduled appointment.   [facility] will follow up with pt on 03/31/16; however, strongly encouraged him to schedule an appointment with [facility] or call with any behavioral health concerns or questions. Pt verbalized understanding.

## 2016-03-31 ENCOUNTER — Ambulatory Visit: Payer: Self-pay | Admitting: Internal Medicine

## 2018-08-04 IMAGING — CT CT ABD-PELV W/ CM
2 of 5 series · 16 of 46 positions shown, 18 images · IV contrast (ISOVUE)
Comparison: None.

CLINICAL DATA: Worsening abdominal pain beginning yesterday.

EXAM:
CT ABDOMEN AND PELVIS WITH CONTRAST
TECHNIQUE: Multidetector CT imaging of the abdomen and pelvis was performed
using the standard protocol following bolus administration of
intravenous contrast.
CONTRAST:  100mL [id number] IOPAMIDOL ([id number]) INJECTION 61%

[Series 2: abd/pel with · axial · 0.80mm/px · z∈[-397,+3]mm · 13 of 94 slices shown, 15 images]
[im 7/94  soft-tissue]
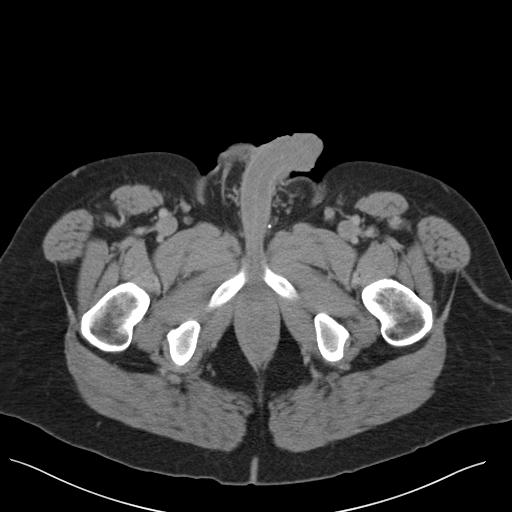
[im 7/94  bone]
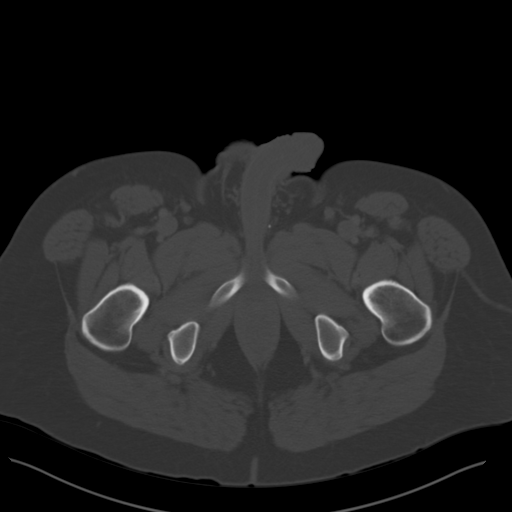
[im 14/94  soft-tissue]
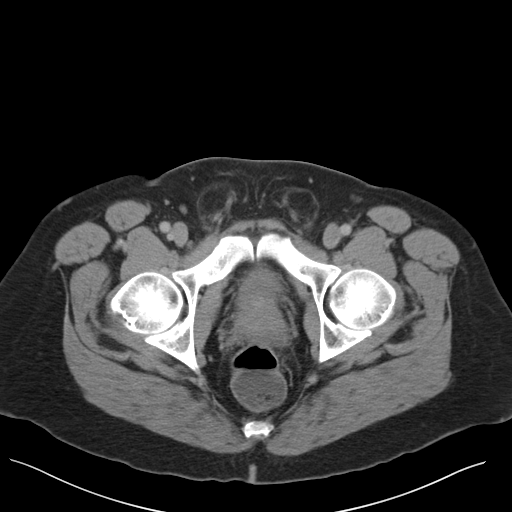
[im 20/94  soft-tissue]
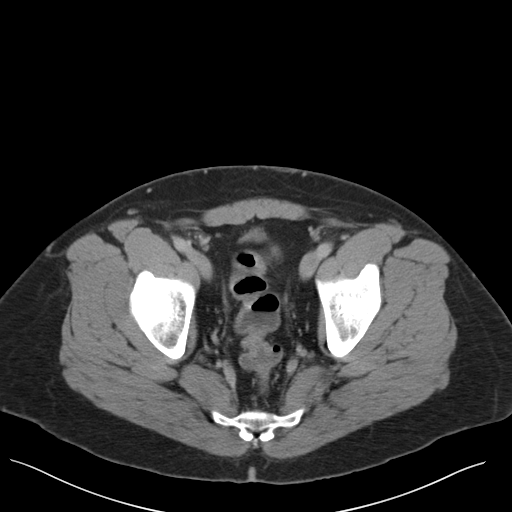
[im 27/94  soft-tissue]
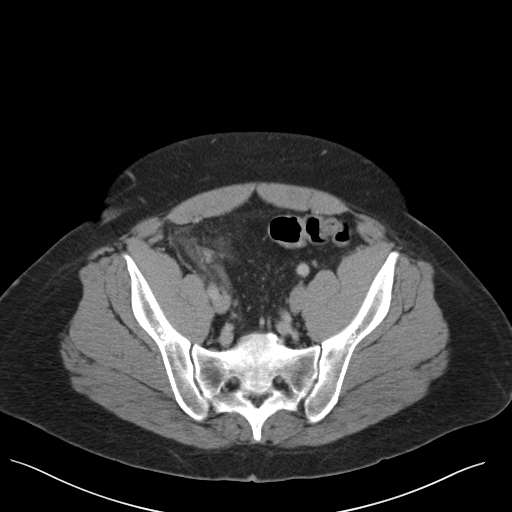
[im 34/94  soft-tissue]
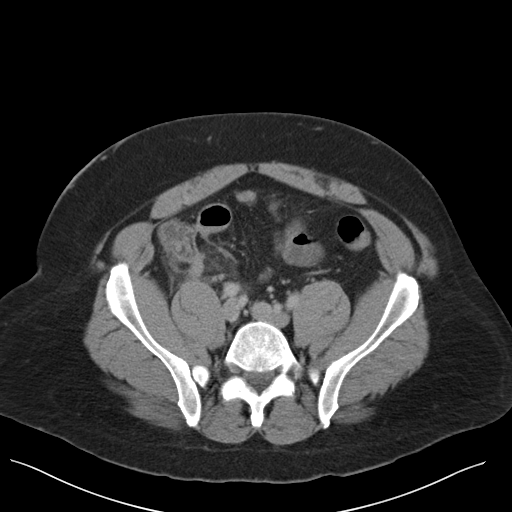
[im 40/94  soft-tissue]
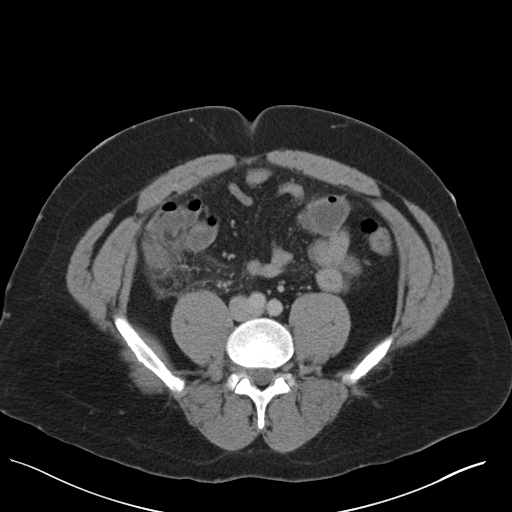
[im 47/94  soft-tissue]
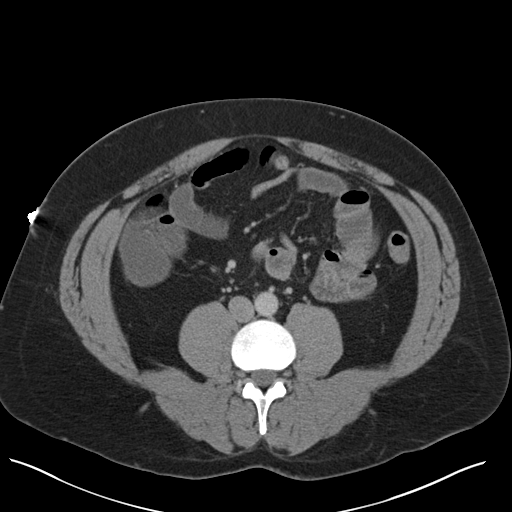
[im 54/94  soft-tissue]
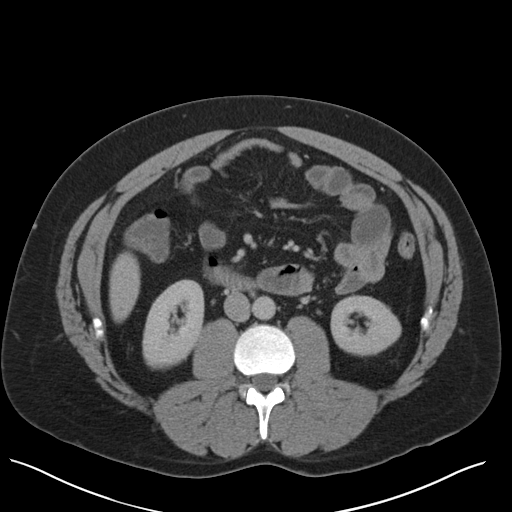
[im 60/94  soft-tissue]
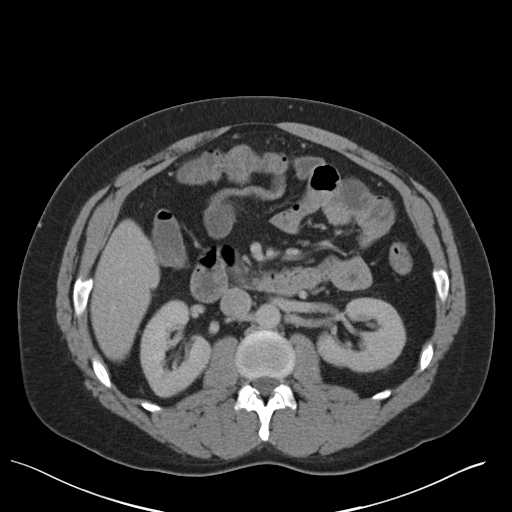
[im 60/94  bone]
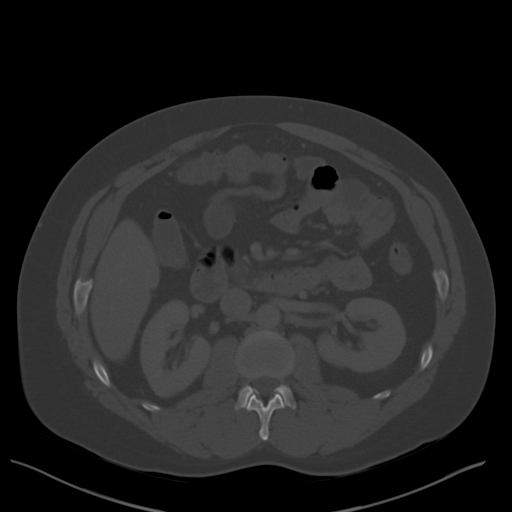
[im 67/94  soft-tissue]
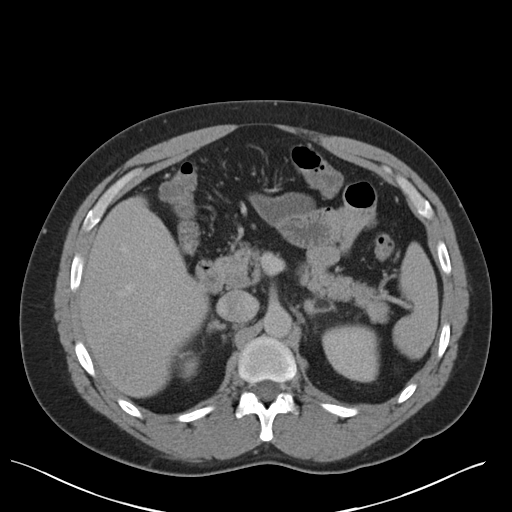
[im 74/94  soft-tissue]
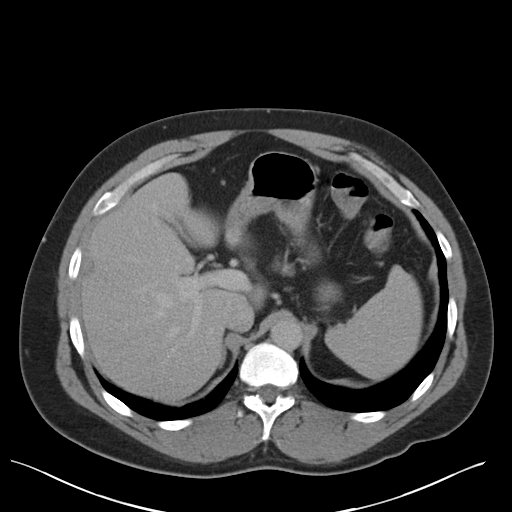
[im 80/94  soft-tissue]
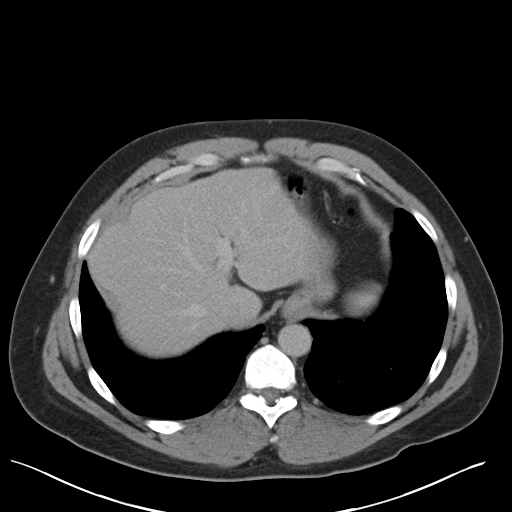
[im 87/94  soft-tissue]
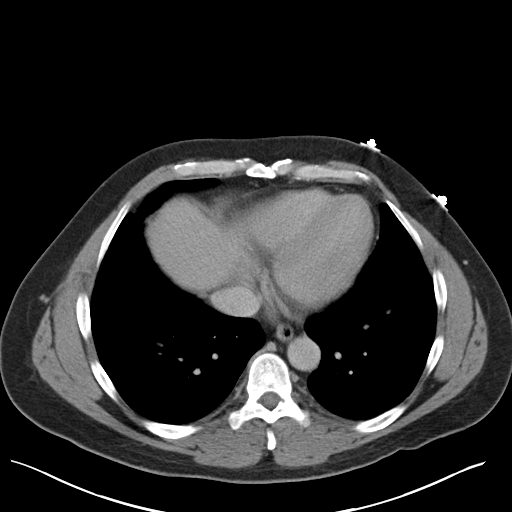

[Series 3: coronal a/|p · coronal · 0.93mm/px · 3 of 151 slices shown]
[im 51/151  soft-tissue]
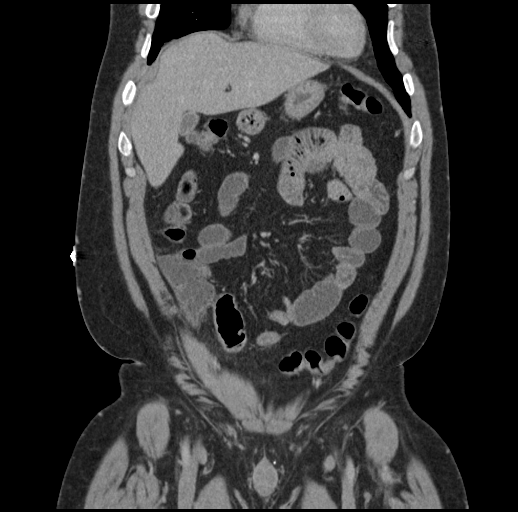
[im 67/151  soft-tissue]
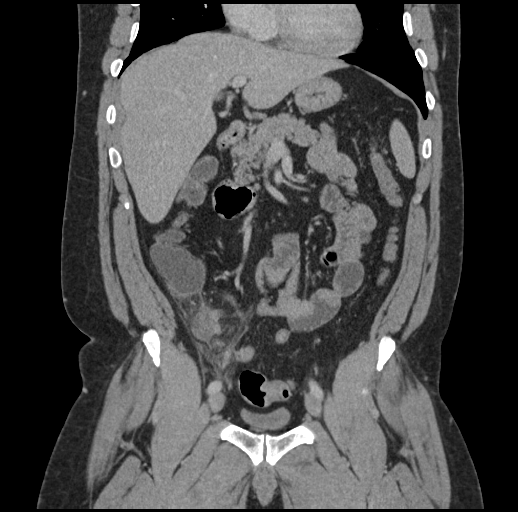
[im 84/151  soft-tissue]
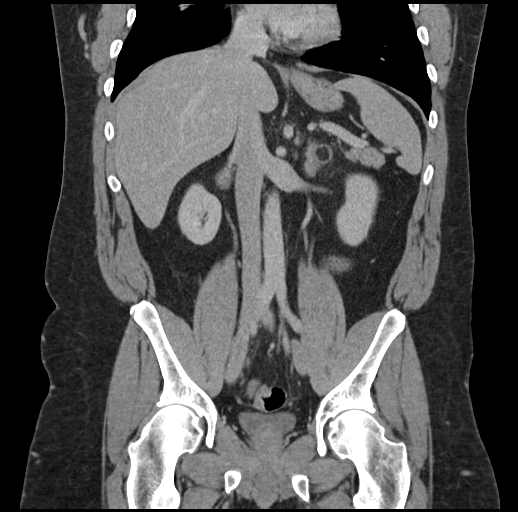

[16 of 46 positions shown; findings below may reference images not displayed]

FINDINGS: Lower chest: No significant finding.

Hepatobiliary: Normal

Pancreas: Normal

Spleen: Normal

Adrenals/Urinary Tract: No significant adrenal finding. Small lipoma
left adrenal gland of no significance. Kidneys are normal
bilaterally without cyst, mass, stone or hydronephrosis.

Stomach/Bowel: Inflammatory changes in the right lower quadrant most
consistent with acute appendicitis. This could be wrapped shared. No
evidence of discrete abscess.

Vascular/Lymphatic: Normal

Reproductive: Normal

Other: None

Musculoskeletal: Normal
IMPRESSION: Acute inflammatory process in the right lower quadrant most
consistent with acute appendicitis. This could be ruptured, as the
appendix itself does not appear markedly dilated. No evidence of
discrete abscess. I do not see any evidence of other processes that
could simulate appendicitis such as diverticulitis or epiploic
appendagitis.

## 2019-05-19 ENCOUNTER — Emergency Department (HOSPITAL_COMMUNITY): Payer: Self-pay

## 2019-05-19 ENCOUNTER — Emergency Department (HOSPITAL_COMMUNITY)
Admission: EM | Admit: 2019-05-19 | Discharge: 2019-05-19 | Disposition: A | Discharge status: Home / Self Care | Payer: Self-pay | Attending: Emergency Medicine | Admitting: Emergency Medicine

## 2019-05-19 ENCOUNTER — Encounter (HOSPITAL_COMMUNITY): Payer: Self-pay | Admitting: Emergency Medicine

## 2019-05-19 DIAGNOSIS — F17210 Nicotine dependence, cigarettes, uncomplicated: Secondary | ICD-10-CM | POA: Insufficient documentation

## 2019-05-19 DIAGNOSIS — Z79899 Other long term (current) drug therapy: Secondary | ICD-10-CM | POA: Insufficient documentation

## 2019-05-19 DIAGNOSIS — R05 Cough: Secondary | ICD-10-CM | POA: Insufficient documentation

## 2019-05-19 DIAGNOSIS — U071 COVID-19: Secondary | ICD-10-CM | POA: Insufficient documentation

## 2019-05-19 LAB — BASIC METABOLIC PANEL
Anion gap: 10 (ref 5–15)
BUN: 9 mg/dL (ref 6–20)
CO2: 25 mmol/L (ref 22–32)
Calcium: 8.7 mg/dL — ABNORMAL LOW (ref 8.9–10.3)
Chloride: 103 mmol/L (ref 98–111)
Creatinine, Ser: 1.3 mg/dL — ABNORMAL HIGH (ref 0.61–1.24)
GFR calc Af Amer: >60 mL/min (ref >60)
GFR calc non Af Amer: >60 mL/min (ref >60)
Glucose, Bld: 150 mg/dL — ABNORMAL HIGH (ref 70–99)
Potassium: 3.3 mmol/L — ABNORMAL LOW (ref 3.5–5.1)
Sodium: 138 mmol/L (ref 135–145)

## 2019-05-19 LAB — CBC
HCT: 44.3 % (ref 39.0–52.0)
Hemoglobin: 14.4 g/dL (ref 13.0–17.0)
MCH: 30.1 pg (ref 26.0–34.0)
MCHC: 32.5 g/dL (ref 30.0–36.0)
MCV: 92.7 fL (ref 80.0–100.0)
Platelets: 270 10*3/uL (ref 150–400)
RBC: 4.78 MIL/uL (ref 4.22–5.81)
RDW: 14.4 % (ref 11.5–15.5)
WBC: 5.6 10*3/uL (ref 4.0–10.5)
nRBC: 0 % (ref 0.0–0.2)

## 2019-05-19 LAB — RESPIRATORY PANEL BY RT PCR (FLU A&B, COVID)
Influenza A by PCR: NEGATIVE
Influenza B by PCR: NEGATIVE
SARS Coronavirus 2 by RT PCR: POSITIVE — AB

## 2019-05-19 LAB — TROPONIN I (HIGH SENSITIVITY)
Troponin I (High Sensitivity): 19 ng/L — ABNORMAL HIGH (ref <18)
Troponin I (High Sensitivity): 19 ng/L — ABNORMAL HIGH (ref <18)

## 2019-05-19 MED ORDER — BENZONATATE 100 MG PO CAPS: 100.0000 mg | ORAL_CAPSULE | Freq: Three times a day (TID) | ORAL | 0 refills | Status: AC

## 2019-05-19 MED ORDER — SODIUM CHLORIDE 0.9% FLUSH: 3.0000 mL | Freq: Once | INTRAVENOUS | Status: DC

## 2019-05-19 MED ORDER — KETOROLAC TROMETHAMINE 15 MG/ML IJ SOLN
15.0000 mg | Freq: Once | INTRAMUSCULAR | Status: AC
  Administered 2019-05-19: 15 mg via INTRAVENOUS
  Filled 2019-05-19: qty 1

## 2019-05-19 MED ORDER — ALBUTEROL SULFATE HFA 108 (90 BASE) MCG/ACT IN AERS: 2.0000 | INHALATION_SPRAY | RESPIRATORY_TRACT | 0 refills | Status: AC | PRN

## 2019-05-19 MED ORDER — ALBUTEROL SULFATE HFA 108 (90 BASE) MCG/ACT IN AERS
4.0000 | INHALATION_SPRAY | Freq: Once | RESPIRATORY_TRACT | Status: AC
Start: 2019-05-19 — End: 2019-05-19
  Administered 2019-05-19: 4 via RESPIRATORY_TRACT
  Filled 2019-05-19: qty 6.7

## 2019-05-19 NOTE — Discharge Instructions (Signed)
You were diagnosed with covid 19 today. I have prescribed you an inhaler due to the wheezing you have. Your chest xray was clear and your other labs were reassuring. You need to isolate yourself for 10 days AND until you are feeling better AND until you have not had a fever for 72 hours or more. Please return to be re-evaluated if you have severe shortness of breath or cannot stay hydrated with fluids. After you are feeling better and out of isolation you should follow up with a new primary care provider to check your blood pressure and blood sugars to determine ig you need to be started on blood pressure of diabetes medications.

## 2019-05-19 NOTE — ED Notes (Signed)
Taken to xray.

## 2019-05-19 NOTE — ED Notes (Signed)
Patient verbalizes understanding of discharge instructions. Opportunity for questioning and answers were provided. Armband removed by staff, pt discharged from ED. Ambulated out to lobby  

## 2019-05-19 NOTE — ED Triage Notes (Signed)
Pt here from home with c/o left sided chest pain along with sob and fever , no n/v
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
## 2021-10-13 IMAGING — DX DG CHEST 2V
2 series · 2 of 2 positions shown · non-contrast
Comparison: None.

CLINICAL DATA: Weakness and fever for 2 days

EXAM:
CHEST - 2 VIEW

[x chest ap]
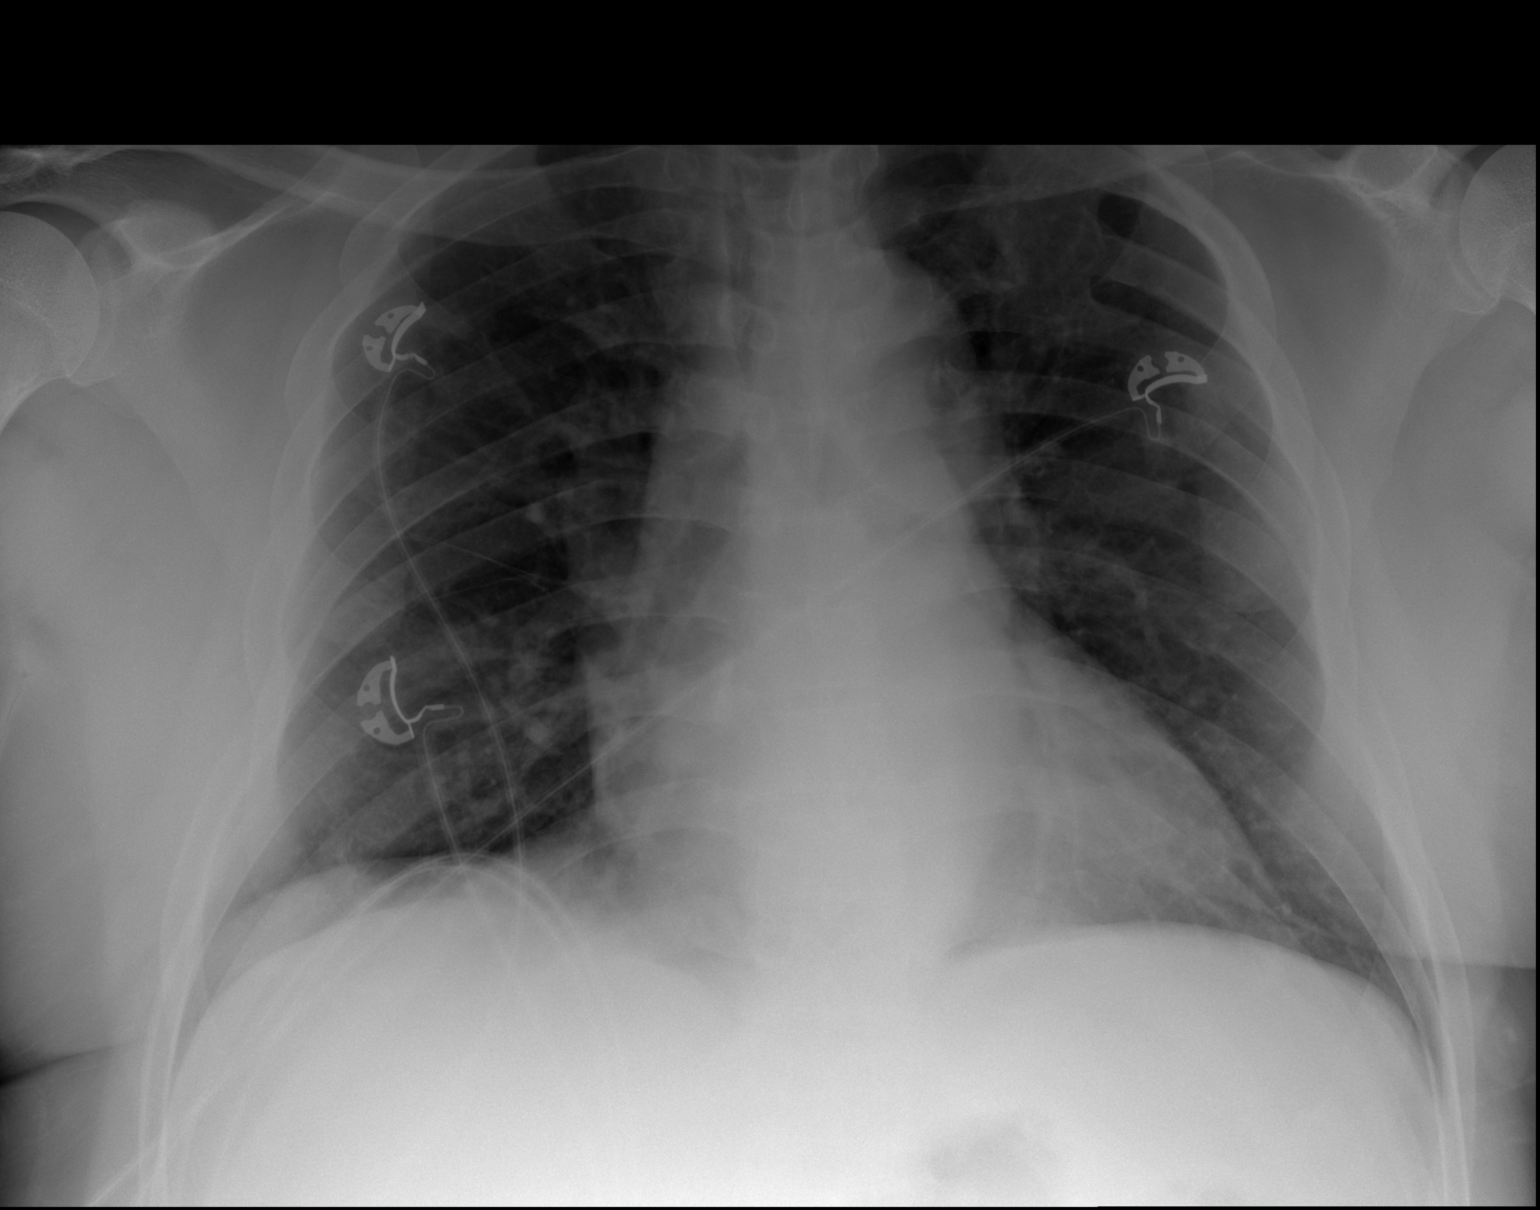

[w chest lat]
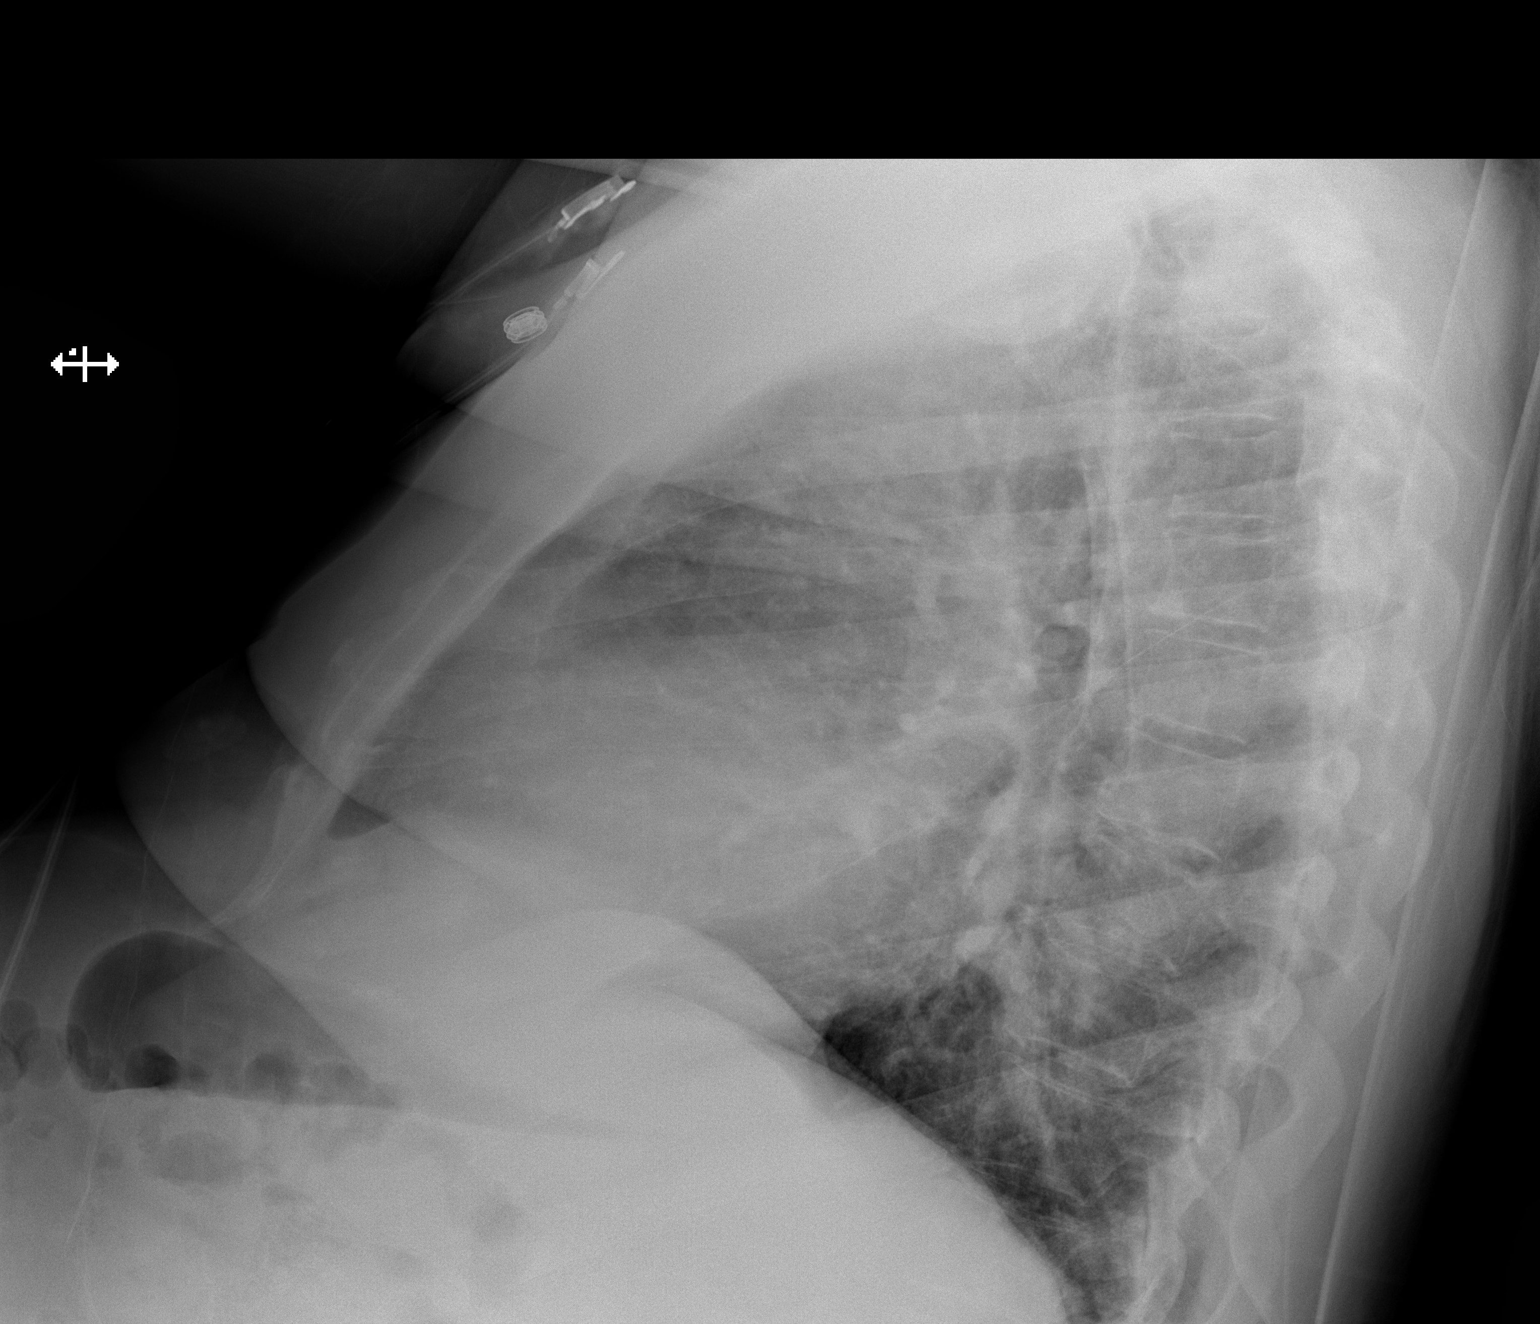

[2 of 2 positions shown; findings below may reference images not displayed]

FINDINGS: Frontal and lateral views of the chest demonstrate an unremarkable
cardiac silhouette. No airspace disease, effusion, or pneumothorax.
Multiple healed left posterior rib fractures.
IMPRESSION: No active cardiopulmonary disease.
